# Patient Record
Sex: Female | Born: 1970 | Race: White | Hispanic: No | Smoking: Never smoker
Health system: Southern US, Community
[De-identification: ages and names within clinical notes are randomized; demographics above are authoritative.]

## PROBLEM LIST (undated history)

## (undated) DIAGNOSIS — F419 Anxiety disorder, unspecified: Secondary | ICD-10-CM

## (undated) DIAGNOSIS — R112 Nausea with vomiting, unspecified: Secondary | ICD-10-CM

## (undated) DIAGNOSIS — M722 Plantar fascial fibromatosis: Secondary | ICD-10-CM

## (undated) DIAGNOSIS — S322XXA Fracture of coccyx, initial encounter for closed fracture: Secondary | ICD-10-CM

## (undated) DIAGNOSIS — E782 Mixed hyperlipidemia: Secondary | ICD-10-CM

## (undated) DIAGNOSIS — E282 Polycystic ovarian syndrome: Secondary | ICD-10-CM

## (undated) DIAGNOSIS — Z8619 Personal history of other infectious and parasitic diseases: Secondary | ICD-10-CM

## (undated) DIAGNOSIS — D649 Anemia, unspecified: Secondary | ICD-10-CM

## (undated) DIAGNOSIS — T7840XA Allergy, unspecified, initial encounter: Secondary | ICD-10-CM

## (undated) DIAGNOSIS — Z9889 Other specified postprocedural states: Secondary | ICD-10-CM

## (undated) HISTORY — DX: Allergy, unspecified, initial encounter: T78.40XA

## (undated) HISTORY — DX: Plantar fascial fibromatosis: M72.2

## (undated) HISTORY — DX: Fracture of coccyx, initial encounter for closed fracture: S32.2XXA

## (undated) HISTORY — DX: Mixed hyperlipidemia: E78.2

## (undated) HISTORY — DX: Anemia, unspecified: D64.9

## (undated) HISTORY — DX: Personal history of other infectious and parasitic diseases: Z86.19

## (undated) HISTORY — PX: TONSILLECTOMY AND ADENOIDECTOMY: SHX28

## (undated) HISTORY — DX: Anxiety disorder, unspecified: F41.9

## (undated) HISTORY — DX: Polycystic ovarian syndrome: E28.2

---

## 1997-08-25 ENCOUNTER — Ambulatory Visit (HOSPITAL_COMMUNITY): Admission: RE | Admit: 1997-08-25 | Discharge: 1997-08-25 | Payer: Self-pay | Admitting: Obstetrics and Gynecology

## 1998-01-16 ENCOUNTER — Encounter: Payer: Self-pay | Admitting: Obstetrics and Gynecology

## 1998-01-16 ENCOUNTER — Ambulatory Visit (HOSPITAL_COMMUNITY): Admission: RE | Admit: 1998-01-16 | Discharge: 1998-01-16 | Payer: Self-pay

## 1998-04-14 ENCOUNTER — Encounter (HOSPITAL_COMMUNITY): Admission: RE | Admit: 1998-04-14 | Discharge: 1998-05-20 | Payer: Self-pay | Admitting: Obstetrics and Gynecology

## 1998-04-27 ENCOUNTER — Inpatient Hospital Stay: Admission: AD | Admit: 1998-04-27 | Discharge: 1998-04-27 | Payer: Self-pay | Admitting: Obstetrics and Gynecology

## 1998-05-19 ENCOUNTER — Inpatient Hospital Stay (HOSPITAL_COMMUNITY): Admission: AD | Admit: 1998-05-19 | Discharge: 1998-05-21 | Payer: Self-pay | Admitting: Obstetrics and Gynecology

## 1998-05-21 ENCOUNTER — Encounter (HOSPITAL_COMMUNITY): Admission: RE | Admit: 1998-05-21 | Discharge: 1998-08-19 | Payer: Self-pay | Admitting: Obstetrics and Gynecology

## 1998-12-30 ENCOUNTER — Other Ambulatory Visit: Admission: RE | Admit: 1998-12-30 | Discharge: 1998-12-30 | Payer: Self-pay | Admitting: Obstetrics and Gynecology

## 1999-12-31 ENCOUNTER — Other Ambulatory Visit: Admission: RE | Admit: 1999-12-31 | Discharge: 1999-12-31 | Payer: Self-pay | Admitting: Obstetrics and Gynecology

## 2000-12-14 ENCOUNTER — Other Ambulatory Visit: Admission: RE | Admit: 2000-12-14 | Discharge: 2000-12-14 | Payer: Self-pay | Admitting: Obstetrics and Gynecology

## 2001-12-26 ENCOUNTER — Other Ambulatory Visit: Admission: RE | Admit: 2001-12-26 | Discharge: 2001-12-26 | Payer: Self-pay | Admitting: Obstetrics and Gynecology

## 2002-12-25 ENCOUNTER — Other Ambulatory Visit: Admission: RE | Admit: 2002-12-25 | Discharge: 2002-12-25 | Payer: Self-pay | Admitting: Obstetrics and Gynecology

## 2003-03-25 ENCOUNTER — Ambulatory Visit (HOSPITAL_COMMUNITY): Admission: RE | Admit: 2003-03-25 | Discharge: 2003-03-25 | Payer: Self-pay | Admitting: Obstetrics and Gynecology

## 2003-12-18 ENCOUNTER — Other Ambulatory Visit: Admission: RE | Admit: 2003-12-18 | Discharge: 2003-12-18 | Payer: Self-pay | Admitting: Obstetrics and Gynecology

## 2004-01-15 ENCOUNTER — Ambulatory Visit: Payer: Self-pay | Admitting: Pulmonary Disease

## 2004-05-20 ENCOUNTER — Encounter: Admission: RE | Admit: 2004-05-20 | Discharge: 2004-05-20 | Payer: Self-pay | Admitting: Obstetrics and Gynecology

## 2004-12-16 ENCOUNTER — Other Ambulatory Visit: Admission: RE | Admit: 2004-12-16 | Discharge: 2004-12-16 | Payer: Self-pay | Admitting: Obstetrics and Gynecology

## 2004-12-17 ENCOUNTER — Encounter: Admission: RE | Admit: 2004-12-17 | Discharge: 2004-12-17 | Payer: Self-pay | Admitting: General Surgery

## 2005-10-28 ENCOUNTER — Ambulatory Visit: Payer: Self-pay | Admitting: Pulmonary Disease

## 2006-01-27 ENCOUNTER — Ambulatory Visit: Payer: Self-pay | Admitting: Pulmonary Disease

## 2006-01-27 LAB — CONVERTED CEMR LAB
ALT: 15 units/L (ref 0–40)
Albumin: 3.3 g/dL — ABNORMAL LOW (ref 3.5–5.2)
Alkaline Phosphatase: 42 units/L (ref 39–117)
CO2: 23 meq/L (ref 19–32)
Calcium: 9 mg/dL (ref 8.4–10.5)
Chol/HDL Ratio, serum: 3
Cholesterol: 154 mg/dL (ref 0–200)
Glomerular Filtration Rate, Af Am: 121 mL/min/{1.73_m2}
Glucose, Bld: 94 mg/dL (ref 70–99)
LDL Cholesterol: 74 mg/dL (ref 0–99)
Sodium: 138 meq/L (ref 135–145)
Total Bilirubin: 0.5 mg/dL (ref 0.3–1.2)

## 2006-02-09 ENCOUNTER — Other Ambulatory Visit: Admission: RE | Admit: 2006-02-09 | Discharge: 2006-02-09 | Payer: Self-pay | Admitting: Obstetrics and Gynecology

## 2006-11-24 ENCOUNTER — Ambulatory Visit: Payer: Self-pay | Admitting: Pulmonary Disease

## 2006-11-24 LAB — CONVERTED CEMR LAB
ALT: 17 units/L (ref 0–35)
Basophils Relative: 0.2 % (ref 0.0–1.0)
Bilirubin Urine: NEGATIVE
Bilirubin, Direct: 0.1 mg/dL (ref 0.0–0.3)
CO2: 25 meq/L (ref 19–32)
Calcium: 8.8 mg/dL (ref 8.4–10.5)
Cholesterol: 146 mg/dL (ref 0–200)
Eosinophils Relative: 0.5 % (ref 0.0–5.0)
GFR calc Af Amer: 144 mL/min
Glucose, Bld: 92 mg/dL (ref 70–99)
HCT: 39.7 % (ref 36.0–46.0)
HDL: 47.6 mg/dL (ref >39.0)
Hemoglobin: 14 g/dL (ref 12.0–15.0)
Leukocytes, UA: NEGATIVE
Lymphocytes Relative: 30.4 % (ref 12.0–46.0)
Monocytes Absolute: 0.6 10*3/uL (ref 0.2–0.7)
Neutro Abs: 6.7 10*3/uL (ref 1.4–7.7)
Neutrophils Relative %: 63.7 % (ref 43.0–77.0)
Platelets: 264 10*3/uL (ref 150–400)
Specific Gravity, Urine: 1.02 (ref 1.000–1.03)
Total Protein: 6.7 g/dL (ref 6.0–8.3)
Triglycerides: 143 mg/dL (ref 0–149)
Urobilinogen, UA: 0.2 (ref 0.0–1.0)
WBC: 10.7 10*3/uL — ABNORMAL HIGH (ref 4.5–10.5)

## 2007-06-11 ENCOUNTER — Telehealth (INDEPENDENT_AMBULATORY_CARE_PROVIDER_SITE_OTHER): Payer: Self-pay | Admitting: *Deleted

## 2007-12-07 ENCOUNTER — Ambulatory Visit: Payer: Self-pay | Admitting: Pulmonary Disease

## 2007-12-11 ENCOUNTER — Encounter: Payer: Self-pay | Admitting: Pulmonary Disease

## 2008-02-15 ENCOUNTER — Ambulatory Visit: Payer: Self-pay | Admitting: Pulmonary Disease

## 2008-04-04 ENCOUNTER — Encounter: Admission: RE | Admit: 2008-04-04 | Discharge: 2008-04-04 | Payer: Self-pay | Admitting: Obstetrics and Gynecology

## 2008-07-01 DIAGNOSIS — B2790 Infectious mononucleosis, unspecified without complication: Secondary | ICD-10-CM

## 2008-07-01 DIAGNOSIS — E282 Polycystic ovarian syndrome: Secondary | ICD-10-CM

## 2008-07-01 DIAGNOSIS — S322XXA Fracture of coccyx, initial encounter for closed fracture: Secondary | ICD-10-CM

## 2008-07-01 DIAGNOSIS — S3210XA Unspecified fracture of sacrum, initial encounter for closed fracture: Secondary | ICD-10-CM | POA: Insufficient documentation

## 2009-03-17 ENCOUNTER — Ambulatory Visit: Payer: Self-pay | Admitting: Pulmonary Disease

## 2009-03-17 DIAGNOSIS — J019 Acute sinusitis, unspecified: Secondary | ICD-10-CM

## 2009-03-27 ENCOUNTER — Ambulatory Visit: Payer: Self-pay | Admitting: Pulmonary Disease

## 2009-03-28 LAB — CONVERTED CEMR LAB
ALT: 27 units/L (ref 0–35)
AST: 24 units/L (ref 0–37)
Basophils Absolute: 0.1 10*3/uL (ref 0.0–0.1)
Bilirubin, Direct: 0.1 mg/dL (ref 0.0–0.3)
CO2: 15 meq/L — ABNORMAL LOW (ref 19–32)
Calcium: 8.9 mg/dL (ref 8.4–10.5)
Eosinophils Absolute: 0.2 10*3/uL (ref 0.0–0.7)
HDL: 48 mg/dL (ref >39)
Indirect Bilirubin: 0.3 mg/dL (ref 0.0–0.9)
Ketones, ur: NEGATIVE mg/dL
Lymphocytes Relative: 31.7 % (ref 12.0–46.0)
Monocytes Relative: 5 % (ref 3.0–12.0)
Neutrophils Relative %: 61.1 % (ref 43.0–77.0)
Platelets: 235 10*3/uL (ref 150.0–400.0)
Potassium: 4.4 meq/L (ref 3.5–5.3)
RDW: 12.1 % (ref 11.5–14.6)
Sodium: 139 meq/L (ref 135–145)
Specific Gravity, Urine: <=1.005 (ref 1.000–1.030)
Total Bilirubin: 0.4 mg/dL (ref 0.3–1.2)
Total Protein, Urine: NEGATIVE mg/dL
Urine Glucose: NEGATIVE mg/dL
Urobilinogen, UA: 0.2 (ref 0.0–1.0)

## 2009-04-08 ENCOUNTER — Telehealth (INDEPENDENT_AMBULATORY_CARE_PROVIDER_SITE_OTHER): Payer: Self-pay | Admitting: *Deleted

## 2009-04-10 ENCOUNTER — Ambulatory Visit: Payer: Self-pay | Admitting: Pulmonary Disease

## 2009-04-10 ENCOUNTER — Encounter: Admission: RE | Admit: 2009-04-10 | Discharge: 2009-04-10 | Payer: Self-pay | Admitting: Obstetrics and Gynecology

## 2009-04-19 LAB — CONVERTED CEMR LAB
BUN: 10 mg/dL (ref 6–23)
Calcium: 9 mg/dL (ref 8.4–10.5)
GFR calc non Af Amer: 117.08 mL/min (ref >60)
Potassium: 4.4 meq/L (ref 3.5–5.1)
Sodium: 138 meq/L (ref 135–145)

## 2009-04-21 ENCOUNTER — Telehealth: Payer: Self-pay | Admitting: Pulmonary Disease

## 2009-06-03 ENCOUNTER — Telehealth (INDEPENDENT_AMBULATORY_CARE_PROVIDER_SITE_OTHER): Payer: Self-pay | Admitting: *Deleted

## 2010-04-09 ENCOUNTER — Ambulatory Visit
Admission: RE | Admit: 2010-04-09 | Discharge: 2010-04-09 | Payer: Self-pay | Source: Home / Self Care | Attending: Pulmonary Disease | Admitting: Pulmonary Disease

## 2010-04-09 ENCOUNTER — Encounter: Payer: Self-pay | Admitting: Pulmonary Disease

## 2010-04-09 ENCOUNTER — Other Ambulatory Visit: Payer: Self-pay | Admitting: Pulmonary Disease

## 2010-04-09 LAB — CBC WITH DIFFERENTIAL/PLATELET
Basophils Absolute: 0 K/uL (ref 0.0–0.1)
Basophils Relative: 0.3 % (ref 0.0–3.0)
Eosinophils Absolute: 0 K/uL (ref 0.0–0.7)
Eosinophils Relative: 0.1 % (ref 0.0–5.0)
HCT: 41.4 % (ref 36.0–46.0)
Hemoglobin: 14.5 g/dL (ref 12.0–15.0)
Lymphocytes Relative: 31.7 % (ref 12.0–46.0)
Lymphs Abs: 3.8 K/uL (ref 0.7–4.0)
MCHC: 35 g/dL (ref 30.0–36.0)
MCV: 91.6 fl (ref 78.0–100.0)
Monocytes Absolute: 0.6 K/uL (ref 0.1–1.0)
Monocytes Relative: 4.9 % (ref 3.0–12.0)
Neutro Abs: 7.6 K/uL (ref 1.4–7.7)
Neutrophils Relative %: 63 % (ref 43.0–77.0)
Platelets: 277 K/uL (ref 150.0–400.0)
RBC: 4.52 Mil/uL (ref 3.87–5.11)
RDW: 12.2 % (ref 11.5–14.6)
WBC: 12.1 K/uL — ABNORMAL HIGH (ref 4.5–10.5)

## 2010-04-09 LAB — LIPID PANEL
Cholesterol: 141 mg/dL (ref 0–200)
HDL: 46.1 mg/dL (ref >39.00)
Total CHOL/HDL Ratio: 3
Triglycerides: 216 mg/dL — ABNORMAL HIGH (ref 0.0–149.0)
VLDL: 43.2 mg/dL — ABNORMAL HIGH (ref 0.0–40.0)

## 2010-04-09 LAB — HEPATIC FUNCTION PANEL
ALT: 17 U/L (ref 0–35)
AST: 17 U/L (ref 0–37)
Albumin: 3.7 g/dL (ref 3.5–5.2)
Alkaline Phosphatase: 46 U/L (ref 39–117)
Bilirubin, Direct: 0.1 mg/dL (ref 0.0–0.3)
Total Bilirubin: 0.9 mg/dL (ref 0.3–1.2)
Total Protein: 7.1 g/dL (ref 6.0–8.3)

## 2010-04-09 LAB — BASIC METABOLIC PANEL WITH GFR
BUN: 13 mg/dL (ref 6–23)
CO2: 24 meq/L (ref 19–32)
Calcium: 9.2 mg/dL (ref 8.4–10.5)
Chloride: 104 meq/L (ref 96–112)
Creatinine, Ser: 0.6 mg/dL (ref 0.4–1.2)
GFR: 116.51 mL/min (ref >60.00)
Glucose, Bld: 77 mg/dL (ref 70–99)
Potassium: 4.4 meq/L (ref 3.5–5.1)
Sodium: 137 meq/L (ref 135–145)

## 2010-04-09 LAB — TSH: TSH: 1.42 u[IU]/mL (ref 0.35–5.50)

## 2010-04-09 LAB — LDL CHOLESTEROL, DIRECT: Direct LDL: 70.3 mg/dL

## 2010-04-11 LAB — CONVERTED CEMR LAB
ALT: 20 units/L (ref 0–35)
AST: 21 units/L (ref 0–37)
Albumin: 3.6 g/dL (ref 3.5–5.2)
Basophils Relative: 0.6 % (ref 0.0–3.0)
CO2: 25 meq/L (ref 19–32)
Calcium: 9 mg/dL (ref 8.4–10.5)
Chloride: 107 meq/L (ref 96–112)
Eosinophils Relative: 0.6 % (ref 0.0–5.0)
GFR calc Af Amer: 177 mL/min
GFR calc non Af Amer: 146 mL/min
HCT: 42.1 % (ref 36.0–46.0)
Hemoglobin: 15 g/dL (ref 12.0–15.0)
LDL Cholesterol: 92 mg/dL (ref 0–99)
Monocytes Absolute: 0.6 10*3/uL (ref 0.1–1.0)
Monocytes Relative: 5.4 % (ref 3.0–12.0)
Mucus, UA: NEGATIVE
Nitrite: NEGATIVE
Platelets: 244 10*3/uL (ref 150–400)
Sodium: 138 meq/L (ref 135–145)
Specific Gravity, Urine: 1.01 (ref 1.000–1.03)
Total Bilirubin: 0.7 mg/dL (ref 0.3–1.2)
Total CHOL/HDL Ratio: 4.7
Total Protein, Urine: NEGATIVE mg/dL
Total Protein: 7 g/dL (ref 6.0–8.3)
Urine Glucose: NEGATIVE mg/dL
WBC, UA: NONE SEEN cells/hpf
WBC: 11.2 10*3/uL — ABNORMAL HIGH (ref 4.5–10.5)

## 2010-04-12 ENCOUNTER — Encounter
Admission: RE | Admit: 2010-04-12 | Discharge: 2010-04-12 | Payer: Self-pay | Source: Home / Self Care | Attending: Obstetrics and Gynecology | Admitting: Obstetrics and Gynecology

## 2010-04-13 NOTE — Progress Notes (Signed)
Summary: sinus drainage  Phone Note Call from Patient   Caller: Patient Call For: nadle Summary of Call: ear pain and sinus drainage with cough [facility] Initial call taken by: Rickard Patience,  June 03, 2009 10:28 AM  Follow-up for Phone Call        Spoke with pt.  She states that she started to have nasal congestion and sinus pressure/HA about 2 wks ago.  She saw her dentist and was txed with cephalexin x 10 days- has 3 doses left.  She states that abx is "not touching it"- now she has ear pain and prod cough with dark yellow sputum.  Please advise recs thanks NKDA Follow-up by: Vernie Murders,  June 03, 2009 10:40 AM  Additional Follow-up for Phone Call Additional follow up Details #1::        per SN---keflex should have done the job of clearing that up---try avelox 400mg   1 tab daily  #7 use the mucinex max 1 by mouth two times a day with plenty of fluids and the next step will be to send her to see ENT if this does not get better after this round of abx. Randell Loop CMA  June 03, 2009 11:08 AM       Additional Follow-up for Phone Call Additional follow up Details #2::    Spoke with pt and notified of the above recs per SN.  Pt verbalized understanding.  Rx for avelox was sent to pharm. Follow-up by: Vernie Murders,  June 03, 2009 11:14 AM  New/Updated Medications: AVELOX 400 MG TABS (MOXIFLOXACIN HCL) 1 by mouth once daily x 7 days Prescriptions: AVELOX 400 MG TABS (MOXIFLOXACIN HCL) 1 by mouth once daily x 7 days  #7 x 0   Entered by:   Vernie Murders   Authorized by:   Michele Mcalpine MD   Signed by:   Vernie Murders on 06/03/2009   Method used:   Electronically to        [facility] [address] [phone]* (retail)       [address]       [place], [place]  [address]       Ph: [phone] or [phone]       Fax: [phone]   RxID:   [id number]   Appended Document: sinus drainage-MED CHANGE FROM AVELOX TO AUGMENTIN PHARMACY CALLED AND  STATES PT SAYS AVELOX RX THAT WAS GIVEN IS TO EXPENSIVE WOULD LIKE REPLACEMENT PER SN OK TO CHANGE TO AUGMENTIN 875 1 by mouth two times a day #20 NO REFILL   Clinical Lists Changes  Medications: Changed medication from AVELOX 400 MG TABS (MOXIFLOXACIN HCL) 1 by mouth once daily x 7 days to AUGMENTIN 875-125 MG TABS (AMOXICILLIN-POT CLAVULANATE) 1 by mouth two times a day - Signed Rx of AUGMENTIN 875-125 MG TABS (AMOXICILLIN-POT CLAVULANATE) 1 by mouth two times a day;  #20 x 0;  Signed;  Entered by: Philipp Deputy CMA;  Authorized by: Michele Mcalpine MD;  Method used: Electronically to [facility] [address] [phone]*, [address] [place], [place], [place]  [address], Ph: [phone] or [phone], Fax: [phone]    Prescriptions: AUGMENTIN 875-125 MG TABS (AMOXICILLIN-POT CLAVULANATE) 1 by mouth two times a day  #20 x 0   Entered by:   Philipp Deputy CMA   Authorized by:   Michele Mcalpine MD   Signed by:   Philipp Deputy CMA on 06/03/2009  Method used:   Electronically to        [facility]. [address] [phone]* (retail)       [address]       [place], [place]  [address]       Ph: [phone] or [phone]       Fax: [phone]   RxID:   [id number]

## 2010-04-13 NOTE — Progress Notes (Signed)
Summary: results    Phone Note Call from Patient   Caller: Patient Call For: Kyandra Mcclaine Summary of Call: calling for lab results Initial call taken by: Rickard Patience,  April 21, 2009 8:19 AM  Follow-up for Phone Call        [facility]. Aundra Millet Reynolds LPN  April 21, 2009 8:50 AM    per SN----the low bicarb level was a lab error as expected---lmomtcb for pt to make her aware. Randell Loop CMA  April 21, 2009 9:00 AM   pt advised.Carron Curie CMA  April 21, 2009 10:53 AM

## 2010-04-13 NOTE — Progress Notes (Signed)
Summary: labs  Phone Note Call from Patient   Caller: Patient Call For: nadel Summary of Call: calling having labs redone per Dr Kriste Basque Initial call taken by: Rickard Patience,  April 08, 2009 1:10 PM  Follow-up for Phone Call        called, spoke with pt.  Pt states per phone tree message from [facility], she needs to have some labs redone.  Per append dated 03/28/09 from SN. looks like pt needs to have BMET redrawn.  Will forward to SN-please advise if pt needs any other labs.  Thanks! Gweneth Dimitri RN  April 08, 2009 2:06 PM   Additional Follow-up for Phone Call Additional follow up Details #1::        thats the only lab that she will need repeated. code is 401.9   thanks Randell Loop CMA  April 08, 2009 2:11 PM     Additional Follow-up for Phone Call Additional follow up Details #2::    Pt requesting to come in on Friday morning for this lab.  Lab scheduled in IDX.  pt aware she is to report to lab for this. She verbalized understanding.  Follow-up by: Gweneth Dimitri RN,  April 08, 2009 2:26 PM

## 2010-04-13 NOTE — Assessment & Plan Note (Signed)
Summary: CPX/FASTING/APC   CC:  Yearly ROV & CPX....  History of Present Illness: 40 y/o WF here for [name] yearly follow up and CPX... she has hypercholestrolemia treated w/ Simva40...   ~  March 27, 2009:  seen recently by TP w/ URI/ sinusitis requiring Augmentin, Pred, Mucinex, Hydromet cough syrup- finally better... otherw had [name] good yr- she decr the Simva40 to just 1/2 tab daily over the last 3-59mo (?to save $$)...  she is followed by [facility] for GYN w/ polycystic dis & HA w/ cycle- changed BCP to [facility] & improved... no other complaints or concerns- some incr stress w/ parents living w/ her ([place])...    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  HYPERCHOLESTEROLEMIA (ICD-272.0) - on SIMVASTATIN 40mg /d, but she decr to 1/2 tab daily recently... we discussed diet + exercise.  ~  FLP 9/08 showed TChol 146, TG 143, HDL 48, LDL 70...  ~  FLP 12/09 showed TChol 159, TG 165, HDL 34, LDL 92... reminder- low fat diet...  ~  FLP 1/11 showed TChol 182, TG 294, HDL 48, LDL 75... OK on Simva20, may need Fibrate- diet 1st.  Hx of MONONUCLEOSIS (ICD-075) - occured at age 29... no known residual.  POLYCYSTIC OVARIAN DISEASE (ICD-256.4) - followed by DrVHaygood who has her on BCPs...  ~  1/11:  [facility] & improved...  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  Hx of FRACTURE, COCCYX (ICD-805.6)    Allergies (verified): No Known Drug Allergies  Comments:  Nurse/Medical Assistant: The patient's medications and allergies were reviewed with the patient and were updated in the Medication and Allergy Lists.  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 allergies - mother, father heart disease - father  rheumatism - mother, father cancer - mother had precancerous cell DM - brother     Social History: Reviewed history from 03/17/2009 and no changes required. 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  The patient denies fever, chills, sweats, anorexia, fatigue, weakness, malaise, weight loss, sleep disorder, blurring, diplopia, eye irritation, eye discharge, vision loss, eye pain, photophobia, earache, ear discharge, tinnitus, decreased hearing, nosebleeds, sore throat, hoarseness, chest pain, palpitations, syncope, dyspnea on exertion, orthopnea, PND, peripheral edema, cough, dyspnea at rest, excessive sputum, hemoptysis, wheezing, pleurisy, nausea, vomiting, diarrhea, constipation, change in bowel habits, abdominal pain, melena, hematochezia, jaundice, gas/bloating, indigestion/heartburn, dysphagia, odynophagia, dysuria, hematuria, urinary frequency, urinary hesitancy, nocturia, incontinence, back pain, joint pain, joint swelling, muscle cramps, muscle weakness, stiffness, arthritis, sciatica, restless legs, leg pain at night, leg pain with exertion, rash, itching, dryness, suspicious lesions, paralysis, paresthesias, seizures, tremors, vertigo, transient blindness, frequent falls, frequent headaches, difficulty walking, depression, anxiety, memory loss, confusion, cold intolerance, heat intolerance, polydipsia, polyphagia, polyuria, unusual weight change, abnormal bruising, bleeding, enlarged lymph nodes, urticaria, allergic rash, hay fever, and recurrent infections.    Vital Signs:  Patient profile:   40 year old female Height:      60 inches Weight:      162.50 pounds  BMI:     31.85 O2 Sat:      97 % on Room air Temp:     97.5 degrees F  oral Pulse rate:   98 / minute BP sitting:   130 / 84  (right arm) Cuff size:   regular  Vitals Entered By: Randell Loop CMA (March 27, 2009 10:19 AM)  O2 Sat at Rest %:  97 O2 Flow:  Room air  Physical Exam  Additional Exam:  WD, WN, 40 y/o WF in NAD... GENERAL:  Alert & oriented; pleasant & cooperative... HEENT:  [place]/AT, EOM-wnl, PERRLA, Fundi-benign, EACs-clear, TMs-wnl, NOSE-clear, THROAT-clear & wnl. NECK:  Supple w/ full ROM; no JVD; normal carotid impulses w/o bruits; no thyromegaly or nodules palpated; no lymphadenopathy. CHEST:  Clear to P & [name]; without wheezes/ rales/ or rhonchi. HEART:  Regular Rhythm; without murmurs/ rubs/ or gallops. ABDOMEN:  Soft & nontender; normal bowel sounds; no organomegaly or masses detected. EXT: without deformities or arthritic changes; no varicose veins/ venous insuffic/ or edema. NEURO:  CN's intact; motor testing normal; sensory testing normal; gait normal & balance OK. DERM:  No lesions noted; no rash etc...     EKG  Procedure date:  03/27/2009  Findings:      Normal sinus rhythm with rate of:  86/min... Tracing is WNL, NAD...  SN   MISC. Report  Procedure date:  03/27/2009  Findings:      CBC Platelet w/Diff (CBCD)   White Cell Count     [H]  10.8 K/uL                   4.5-10.5   Red Cell Count            4.83 Mil/uL                 3.87-5.11   Hemoglobin           [H]  15.1 g/dL                   81.1-91.4   Hematocrit                45.7 %                      36.0-46.0   MCV                       94.6 fl                     78.0-100.0   Platelet Count            235.0 K/uL                  150.0-400.0   Neutrophil %              61.1 %                      43.0-77.0   Lymphocyte %              31.7 %                      12.0-46.0   Monocyte %                5.0 %  3.0-12.0   Eosinophils%              1.5 %                       0.0-5.0   Basophils %               0.7 %                        0.0-3.0   TSH (TSH)   FastTSH                   1.64 uIU/mL                 0.35-5.50  UDip w/Micro (URINE)   Color                     LT. YELLOW   Clarity                   CLEAR                       Clear   Specific Gravity          <=1.005                     1.000 - 1.030   Urine Ph                  6.0                         5.0-8.0   Protein                   NEGATIVE                    Negative   Urine Glucose             NEGATIVE                    Negative   Ketones                   NEGATIVE                    Negative   Urine Bilirubin           NEGATIVE                    Negative   Blood                     LARGE                       Negative  Urobilinogen              0.2                         0.0 - 1.0   Leukocyte Esterace        NEGATIVE                    Negative   Nitrite                   NEGATIVE  Negative   Urine RBC                 11-20/hpf                   0-2/hpf   Urine Epith               Rare(0-4/hpf)               Rare(0-4/hpf)   Urine Bacteria            Rare(<10/hpf)               None   Comments:      Basic Metabolic Panel (78469)   Sodium                    139 mEq/L                   135-145   Potassium                 4.4 mEq/L                   3.5-5.3   Chloride                  103 mEq/L                   96-112   CO2                  [L]  15 mEq/L                    19-32     Result repeated and verified.   Glucose                   86 mg/dL                    62-95   BUN                       12 mg/dL                    2-84   Creatinine                0.59 mg/dL                  0.40-1.20   Calcium                   8.9 mg/dL                   1.3-24.4  Lipid Profile (01027)   Cholesterol               182 mg/dL                   2-536   Triglyceride         [H]  294 mg/dL                   <644   HDL Cholesterol           48 mg/dL                    >03  LDL Cholesterol (Calc)  75  mg/dL                    0-45  Liver Profile (40981)   Bilirubin, Total          0.4 mg/dL                   1.9-1.4   Bilirubin, Direct         0.1 mg/dL                   7.8-2.9   Indirect Bilirubin        0.3 mg/dL                   5.6-2.1   Alkaline Phosphatase      41 U/L                      39-117   AST/SGOT                  24 U/L                      0-37   ALT/SGPT                  27 U/L                      0-35   Total Protein             7.3 g/dL                    3.0-8.6   Albumin                   4.1 g/dL                    5.7-8.4   Impression & Recommendations:  Problem # 1:  PHYSICAL EXAMINATION (ICD-V70.0) REVIEW of DATA shows low bicarb level = 15 why? - no reason apparent, r/o lab error w/ repeat BMET & go from there...  Orders: EKG w/ Interpretation (93000) Venipuncture (69629) TLB-CBC Platelet - w/Differential (85025-CBCD) TLB-TSH (Thyroid Stimulating Hormone) (84443-TSH) TLB-Udip w/ Micro (81001-URINE) T- * Misc. Laboratory test [id number])  Problem # 2:  HYPERCHOLESTEROLEMIA (ICD-272.0) OK on the SIMVA20, but TG elevated and needs improvement... may need fibrate- hold for now & Rx w/ diet & f/u... Her updated medication list for this problem includes:    Simvastatin 40 Mg Tabs (Simvastatin) .Marland Kitchen... Take as directed at bedtime...  Problem # 3:  POLYCYSTIC OVARIAN DISEASE (ICD-256.4) [facility]- on BCP's...   Problem # 4:  OTHER MEDICAL PROBLEMS AS NOTED>>>  Complete Medication List: 1)  Simvastatin 40 Mg Tabs (Simvastatin) .... Take as directed at bedtime.Marland KitchenMarland Kitchen 2)  Necon 1/35 (28) 1-35 Mg-mcg Tabs (Norethindrone-eth estradiol) .... Take 1 tablet by mouth once [name] day  Other Orders: Prescription Created Electronically [id number])  Patient Instructions: 1)  Today we updated your med list- see below.... 2)  We refilled the simvastatin Rx as discussed... 3)  Today we did your follow up FASTING blood work... please call the "phone tree" in [name] few days  for your lab results.Marland KitchenMarland Kitchen 4)  Keep up the good work w/ diet + exercise... 5)  Call for any problems.Marland KitchenMarland Kitchen 6)  Please schedule [name] follow-up appointment in 1 year, sooner as needed... Prescriptions: SIMVASTATIN 40 MG  TABS (SIMVASTATIN) take as directed at  bedtime...  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  HYPERCHOLESTEROLEMIA (ICD-272.0) - on SIMVASTATIN 40mg /d... we discussed diet + exercise.  ~  FLP 9/08 showed TChol 146, TG 143, HDL 48, LDL 70...  ~  FLP 12/09 showed TChol 159, TG 165, HDL 34, LDL 92... reminder- low fat diet...  Hx of MONONUCLEOSIS (ICD-075) - occured at age 74...  POLYCYSTIC OVARIAN DISEASE (ICD-256.4) - followed by DrVHaygood who has her on BCPs...  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  Hx of FRACTURE, COCCYX (ICD-805.6)   Recorded: 03/27/2009 10:38 AM P/PR: 92 ms / 135 ms - Heart rate (maximum exercise) QRS: 83 QT/QTc/QTd: 358 ms / 405 ms / 69 ms - Heart rate (maximum exercise)  P/QRS/T axis: 49 deg / 97 deg / 12 deg - Heart rate (maximum exercise)  Heartrate: 87 bpm  Interpretation:  Normal sinus rhythm with rate of:  86/min... Tracing is WNL, NAD...  SN

## 2010-04-13 NOTE — Assessment & Plan Note (Signed)
Summary: Acute NP office visit - prod cough   CC:  head congestion with PND causing prod cough with thick yellow-brown sputum, increased SOB, and hoarseness x96month - has finished zpak given by dentist for poss sinus infection.Marland Kitchen  History of Present Illness: 40  y/o WF with known history of  hypercholestrolemia   March 17, 2009--Presents for an acute office visit. Complains of head congestion with PND causing prod cough with thick yellow-brown sputum, increased SOB, hoarseness x13month - has finished zpak given by dentist for poss sinus infection. ( 2 weeks ago) Not anybetter, now with progressively worsening cough. Ear pain and teeth pain. Denies chest pain, dyspnea, orthopnea, hemoptysis, fever, n/v/d, edema, headache,recent travel   Medications Prior to Update: 1)  Simvastatin 40 Mg  Tabs (Simvastatin) .... Take 1 Tablet By Mouth At Bedtime.Marland KitchenMarland Kitchen 2)  Necon 1/35 (28) 1-35 Mg-Mcg Tabs (Norethindrone-Eth Estradiol) .... Take 1 Tablet By Mouth Once A Day  Current Medications (verified): 1)  Simvastatin 40 Mg  Tabs (Simvastatin) .... Take 1 Tablet By Mouth At Bedtime.Marland KitchenMarland Kitchen 2)  Necon 1/35 (28) 1-35 Mg-Mcg Tabs (Norethindrone-Eth Estradiol) .... 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  HYPERCHOLESTEROLEMIA (ICD-272.0) - on SIMVASTATIN 40mg /d... we discussed diet + exercise.  ~  FLP 9/08 showed TChol 146, TG 143, HDL 48,  LDL 70...  ~  FLP 12/09 showed TChol 159, TG 165, HDL 34, LDL 92... reminder- low fat diet...  Hx of MONONUCLEOSIS (ICD-075) - occured at age 70...  POLYCYSTIC OVARIAN DISEASE (ICD-256.4) - followed by DrVHaygood who has her on BCPs...  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 Is Patient Diabetic? No Comments Medications reviewed with patient Daytime contact number verified with patient. Boone Master CNA  March 17, 2009 10:59 AM    Physical Exam  Additional Exam:  WD, WN, 40  y/o WF in NAD... GENERAL:  Alert & oriented; pleasant & cooperative. HEENT:  [place]/AT  , EACs-clear, TMs-mild redness,  NOSE-red, max sinus tenderness. , THROAT-clear & wnl. NECK:  Supple w/ full ROM; no JVD; normal carotid impulses w/o bruits; no thyromegaly or nodules palpated; no lymphadenopathy. CHEST:  Clear to P & A; without wheezes/ rales/ or rhonchi. HEART:  Regular Rhythm; without murmurs/ rubs/ or gallops. ABDOMEN:  Soft & nontender; normal bowel sounds; no organomegaly or masses detected. EXT: without deformities or arthritic  changes; no varicose veins/ venous insuffic/ or edema.     Impression & Recommendations:  Problem # 1:  SINUSITIS, ACUTE (ICD-461.9) Slow to resolve w/ associated bronchitis  REC: Augmentin 875mg  two times a day w/ food for 10 days Mucinex DM two times a day as needed cough/congestion Prednisone taper over next week.  Please contact office for sooner follow up if symptoms do not improve or worsen  follow up Dr. Kriste Basque as scheduled  Her updated medication list for this problem includes:    Augmentin 875-125 Mg Tabs (Amoxicillin-pot clavulanate) .Marland Kitchen... 1 by mouth two times a day    Hydromet 5-1.5 Mg/79ml Syrp (Hydrocodone-homatropine) .Marland Kitchen... 1-2 tsp every 4-6 hr as needed cough  Orders: Est. Patient Level IV ([address])  Medications Added to Medication List This Visit: 1)  Augmentin 875-125 Mg Tabs (Amoxicillin-pot clavulanate) .Marland Kitchen.. 1 by mouth two times a day 2)  Prednisone 10 Mg Tabs (Prednisone) .... 4 tabs for 2 days, then 3 tabs for 2 days, 2 tabs for 2 days, then 1 tab for 2 days, then stop 3)  Hydromet 5-1.5 Mg/14ml Syrp (Hydrocodone-homatropine) .Marland Kitchen.. 1-2 tsp every 4-6 hr as needed cough  Complete Medication List: 1)  Simvastatin 40 Mg Tabs (Simvastatin) .... Take 1 tablet by mouth at bedtime.Marland KitchenMarland Kitchen 2)  Necon 1/35 (28) 1-35 Mg-mcg Tabs (Norethindrone-eth estradiol) .... Take 1 tablet by mouth once a day 3)  Augmentin 875-125 Mg Tabs (Amoxicillin-pot clavulanate) .Marland Kitchen.. 1 by mouth two times a day 4)  Prednisone 10 Mg Tabs (Prednisone) .... 4 tabs for 2 days, then 3 tabs for 2 days, 2 tabs for 2 days, then 1 tab for 2 days, then stop 5)  Hydromet 5-1.5 Mg/34ml Syrp (Hydrocodone-homatropine) .Marland Kitchen.. 1-2 tsp every 4-6 hr as needed cough  Patient Instructions: 1)  Augmentin 875mg  two times a day w/ food for 10 days 2)  Mucinex DM two times a day as needed cough/congestion 3)  Prednisone taper over next week.  4)  Hydromet 1 tsp every 6 hr as needed  5)  Please contact office for sooner follow up if  symptoms do not improve or worsen  6)  follow up Dr. Kriste Basque as scheduled  Prescriptions: HYDROMET 5-1.5 MG/5ML SYRP (HYDROCODONE-HOMATROPINE) 1-2 tsp every 4-6 hr as needed cough  #8 oz x 0   Entered and Authorized by:   Rubye Oaks NP   Signed by:   Sonu Kruckenberg NP on 03/17/2009   Method used:   Print then Give to Patient   RxID:   [id number] PREDNISONE 10 MG TABS (PREDNISONE) 4 tabs for 2 days, then 3 tabs for 2 days, 2 tabs for 2 days, then 1 tab for 2 days, then stop  #20 x 0   Entered and Authorized by:   Rubye Oaks NP   Signed by:   Deveney Bayon NP on 03/17/2009   Method used:   Electronically to        [facility]. [address] [phone]* (retail)  [address]       [place], [place]  [address]       Ph: [phone] or [phone]       Fax: [phone]   RxID:   [id number] AUGMENTIN 875-125 MG TABS (AMOXICILLIN-POT CLAVULANATE) 1 by mouth two times a day  #20 x 0   Entered and Authorized by:   Rubye Oaks NP   Signed by:   Tor Tsuda NP on 03/17/2009   Method used:   Electronically to        [facility]. [address] [phone]* (retail)       [address]       [place], [place]  [address]       Ph: [phone] or [phone]       Fax: [phone]   RxID:   [id number]

## 2010-04-15 ENCOUNTER — Telehealth: Payer: Self-pay | Admitting: Pulmonary Disease

## 2010-04-21 NOTE — Progress Notes (Signed)
Summary: results  Phone Note Call from Patient   Caller: Patient Call For: NADEL Summary of Call: Patient phoned stated that she had her cpx with dr Kriste Basque and did not get the id to call for results. Patient would like the results she can be reached at [phone] Initial call taken by: Vedia Coffer,  April 15, 2010 11:17 AM  Follow-up for Phone Call        Pt given number for phone tree and MRN number.Carron Curie CMA  April 15, 2010 12:12 PM

## 2010-04-24 DIAGNOSIS — E782 Mixed hyperlipidemia: Secondary | ICD-10-CM | POA: Insufficient documentation

## 2010-04-24 DIAGNOSIS — E559 Vitamin D deficiency, unspecified: Secondary | ICD-10-CM | POA: Insufficient documentation

## 2010-04-29 NOTE — Assessment & Plan Note (Signed)
Summary: cpx/ mbw   CC:  Yearly ROV & review of mult medical problems....  History of Present Illness: 40 y/o WF here for a yearly follow up and CPX... she has hypercholestrolemia treated w/ Simva20...   ~  March 27, 2009:  seen recently by TP w/ URI/ sinusitis requiring Augmentin, Pred, Mucinex, Hydromet cough syrup- finally better... otherw had a good yr- she decr the Simva40 to just 1/2 tab daily over the last 3-47mo (?to save $$)...  she is followed by [facility] for GYN w/ polycystic dis & HA w/ cycle- changed BCP to [facility] & improved... no other complaints or concerns- some incr stress w/ parents living w/ [place] ([place]   ~  April 09, 2010:  she's had a good yr but recent URI treated by Dentist w/ ZPak; but notes incr congestion, thick yellow sput, HA, etc... **we discussed rx w/ Augmentin, Depo/ Dosepak, Mucinex, Delsym, etc...  she has Hyperlipidemia & Chol OK on the Simva20 but needs better low fat diet;  she reports Vit D was low per GYN & placed on 50K weekly...    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  ~  FLP 9/08 showed TChol 146, TG 143, HDL 48, LDL 70... Chol is good on Simva20.  ~  FLP 12/09 showed TChol 159, TG 165, HDL 34, LDL 92... reminder- low fat diet...  ~  FLP 1/11 showed TChol 182, TG 294, HDL 48, LDL 75... OK on Simva20, may need Fibrate.  ~  FLP 1/12 showed TChol 141, TG 216, HDL 46,LDL 70... contin same w/ low fat diet.  Hx of MONONUCLEOSIS (ICD-075) - occured at age 81... no known residual.  POLYCYSTIC OVARIAN DISEASE (ICD-256.4) - followed by DrVHaygood who has her on BCPs...  ~  1/11:  [facility] & improved...  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  Hx of FRACTURE, COCCYX (ICD-805.6)  VITAMIN D  DEFICIENCY (ICD-268.9) - she tells me that her VitD level was low when checked by GYN & she's been placed on 50,000 u weekly...   Current Meds:  ASPIRIN 81 MG TBEC (ASPIRIN) Take 1 tablet by mouth once a day SIMVASTATIN 40 MG  TABS (SIMVASTATIN) take as directed at bedtime... FISH OIL 1000 MG CAPS (OMEGA-3 FATTY ACIDS) Take 1 tablet by mouth once a day NECON 1/35 (28) 1-35 MG-MCG TABS (NORETHINDRONE-ETH ESTRADIOL) Take 1 tablet by mouth once a day VITAMIN D3 50000 UNIT CAPS (CHOLECALCIFEROL) take one capsule by mouth once weekly VITAMIN E 100 UNIT CAPS (VITAMIN E) Take 1 tablet by mouth once a day AUGMENTIN 875-125 MG TABS (AMOXICILLIN-POT CLAVULANATE) take 1 tab by mouth two times a day... PREDNISONE (PAK) 5 MG TABS (PREDNISONE) take as directed til gone... MUCINEX 600 MG XR12H-TAB (GUAIFENESIN) take 1-2 tabs by mouth two times a day w/ fluids...    Preventive Screening-Counseling & Management  Alcohol-Tobacco     Smoking Status: never  Allergies (verified): No Known Drug Allergies  Comments:  Nurse/Medical Assistant: The patient's medications and allergies were reviewed with the patient and were updated in the Medication and Allergy Lists.  Past History:  Past Medical History: MIXED HYPERLIPIDEMIA (ICD-272.2) Hx of MONONUCLEOSIS (ICD-075) POLYCYSTIC OVARIAN DISEASE (ICD-256.4) Hx of PLANTAR FASCIITIS (ICD-728.71) Hx of FRACTURE, COCCYX (ICD-805.6) VITAMIN D DEFICIENCY (  ICD-268.9)  Past Surgical History: S/P T & A age 19  Family History: Reviewed history from 03/17/2009 and no changes required. allergies - mother, father heart disease - father  rheumatism - mother, father cancer - mother had precancerous cell DM - brother  Social History: Reviewed history from 03/17/2009 and no changes required. 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  The patient denies fever, chills, sweats, anorexia, fatigue, weakness, malaise, weight loss, sleep disorder, blurring, diplopia, eye irritation, eye discharge, vision loss, eye pain, photophobia, earache, ear discharge, tinnitus, decreased hearing, nosebleeds, chest pain, palpitations, syncope, dyspnea on exertion, orthopnea, PND, peripheral edema, dyspnea at rest, excessive sputum, hemoptysis, wheezing, pleurisy, nausea, vomiting, diarrhea, constipation, change in bowel habits, abdominal pain, melena, hematochezia, jaundice, gas/bloating, indigestion/heartburn, dysphagia, odynophagia, dysuria, hematuria, urinary frequency, urinary hesitancy, nocturia, incontinence, back pain, joint pain, joint swelling, muscle cramps, muscle weakness, stiffness, arthritis, sciatica, restless legs, leg pain at night, leg pain with exertion, rash, itching, dryness, suspicious lesions, paralysis, paresthesias, seizures, tremors, vertigo, transient blindness, frequent falls, frequent headaches, difficulty walking, depression, anxiety, memory loss, confusion, cold intolerance, heat intolerance, polydipsia, polyphagia, polyuria, unusual weight change, abnormal bruising, bleeding, enlarged lymph nodes, urticaria, allergic rash, hay fever, and recurrent infections.    Vital Signs:  Patient profile:   40 year old female Height:      60 inches Weight:      156 pounds BMI:     30.58 O2 Sat:      99 % on Room air Temp:     96.8 degrees F oral Pulse rate:   93 / minute BP sitting:   118 / 82  (right arm) Cuff size:   regular  Vitals Entered By: Randell Loop CMA (April 09, 2010 9:20 AM)  O2 Sat at Rest %:  99 O2 Flow:  Room air CC: Yearly ROV & review of mult medical problems... Is Patient Diabetic? No Pain Assessment Patient in pain? no      Comments meds updated today with pt   Physical Exam  Additional Exam:  WD, WN, 40 y/o WF in NAD... GENERAL:  Alert & oriented; pleasant & cooperative... HEENT:   [place]/AT, EOM-wnl, PERRLA, Fundi-benign, EACs-clear, TMs-wnl, NOSE-clear, THROAT-clear & wnl. NECK:  Supple w/ full ROM; no JVD; normal carotid impulses w/o bruits; no thyromegaly or nodules palpated; no lymphadenopathy. CHEST:  Clear to P & A; without wheezes/ rales/ or rhonchi. HEART:  Regular Rhythm; without murmurs/ rubs/ or gallops. ABDOMEN:  Soft & nontender; normal bowel sounds; no organomegaly or masses detected. EXT: without deformities or arthritic changes; no varicose veins/ venous insuffic/ or edema. NEURO:  CN's intact; motor testing normal; sensory testing normal; gait normal & balance OK. DERM:  No lesions noted; no rash etc...    Impression & Recommendations:  Problem # 1:  PHYSICAL EXAMINATION (ICD-V70.0) CXR, EKG, Labs >> all reviewed w/ pt... Orders: EKG w/ Interpretation (93000) T-2 View CXR (71020TC) TLB-BMP (Basic Metabolic Panel-BMET) (80048-METABOL) TLB-Hepatic/Liver Function Pnl (80076-HEPATIC) TLB-CBC Platelet - w/Differential (85025-CBCD) TLB-Lipid Panel (80061-LIPID) TLB-TSH (Thyroid Stimulating Hormone) (84443-TSH)  Problem # 2:  HYPERCHOLESTEROLEMIA (ICD-272.0) She has a mixed hyperlipidemia> on Simva20 + low fat diet... Her updated medication list for this problem includes:    Simvastatin 40 Mg Tabs (Simvastatin) .Marland Kitchen... Take as directed at bedtime...  Problem # 3:  POLYCYSTIC OVARIAN DISEASE (ICD-256.4) Followed by GYN on BCP's &  improved..  Problem # 4:  VIT D DEFICIENCY >>> Vit D level followed by GYN & Rx w/ 50K weekly...  Complete Medication List: 1)  Aspirin 81 Mg Tbec (Aspirin) .... Take 1 tablet by mouth once a day 2)  Simvastatin 40 Mg Tabs (Simvastatin) .... Take as directed at bedtime.Marland KitchenMarland Kitchen 3)  Fish Oil 1000 Mg Caps (Omega-3 fatty acids) .... Take 1 tablet by mouth once a day 4)  Necon 1/35 (28) 1-35 Mg-mcg Tabs (Norethindrone-eth estradiol) .... Take 1 tablet by mouth once a day 5)  Vitamin D3 50000 Unit Caps (Cholecalciferol) .... Take one  capsule by mouth once weekly 6)  Vitamin E 100 Unit Caps (Vitamin e) .... Take 1 tablet by mouth once a day 7)  Augmentin 875-125 Mg Tabs (Amoxicillin-pot clavulanate) .... Take 1 tab by mouth two times a day... 8)  Prednisone (pak) 5 Mg Tabs (Prednisone) .... Take as directed til gone... 9)  Mucinex 600 Mg Xr12h-tab (Guaifenesin) .... Take 1-2 tabs by mouth two times a day w/ fluids...  Patient Instructions: 1)  Today we updated your med list- see below.... 2)  We refilled your Simvastatin.Marland KitchenMarland Kitchen 3)  For the upper resp infection:  take the AUGMENTIN antibiotic twice daily x7d, and the Pred Dosepak as directed... 4)  You may also use the OTC MUCINEX 1-2 tabs twice daily w/ lots of fluids, and DELSYM cough syrup as needed... 5)  Keep up the good work w/ your LOW FAT diet & exercise program... 6)  Call for any problems.Marland KitchenMarland Kitchen 7)  Please schedule a follow-up appointment in 1 year. Prescriptions: PREDNISONE (PAK) 5 MG TABS (PREDNISONE) take as directed til gone...  #5mg -6d pack x 0   Entered and Authorized by:   Michele Mcalpine MD   Signed by:   Michele Mcalpine MD on 04/09/2010   Method used:   Print then Give to Patient   RxID:   [id number] AUGMENTIN 875-125 MG TABS (AMOXICILLIN-POT CLAVULANATE) take 1 tab by mouth two times a day...  #14 x 1   Entered and Authorized by:   Michele Mcalpine MD   Signed by:   Michele Mcalpine MD on 04/09/2010   Method used:   Print then Give to Patient   RxID:   [id number] SIMVASTATIN 40 MG  TABS (SIMVASTATIN) take as directed at bedtime...  #90 x 4   Entered and Authorized by:   Michele Mcalpine MD   Signed by:   Michele Mcalpine MD on 04/09/2010   Method used:   Print then Give to Patient   RxID:   [id number]    Immunization History:  Influenza Immunization History:    Influenza:  historical (01/25/2010)

## 2011-03-16 ENCOUNTER — Other Ambulatory Visit: Payer: Self-pay | Admitting: Obstetrics and Gynecology

## 2011-03-16 DIAGNOSIS — Z1231 Encounter for screening mammogram for malignant neoplasm of breast: Secondary | ICD-10-CM

## 2011-04-11 ENCOUNTER — Other Ambulatory Visit (INDEPENDENT_AMBULATORY_CARE_PROVIDER_SITE_OTHER): Payer: BC Managed Care – PPO

## 2011-04-11 ENCOUNTER — Ambulatory Visit (INDEPENDENT_AMBULATORY_CARE_PROVIDER_SITE_OTHER)
Admission: RE | Admit: 2011-04-11 | Discharge: 2011-04-11 | Disposition: A | Discharge status: Home / Self Care | Payer: BC Managed Care – PPO | Source: Ambulatory Visit | Attending: Pulmonary Disease | Admitting: Pulmonary Disease

## 2011-04-11 ENCOUNTER — Encounter: Payer: Self-pay | Admitting: Pulmonary Disease

## 2011-04-11 ENCOUNTER — Ambulatory Visit (INDEPENDENT_AMBULATORY_CARE_PROVIDER_SITE_OTHER): Payer: BC Managed Care – PPO | Admitting: Pulmonary Disease

## 2011-04-11 VITALS — BP 130/88 | HR 89 | Temp 96.8°F | Ht 60.0 in | Wt 169.2 lb

## 2011-04-11 DIAGNOSIS — Z0000 Encounter for general adult medical examination without abnormal findings: Secondary | ICD-10-CM

## 2011-04-11 DIAGNOSIS — E782 Mixed hyperlipidemia: Secondary | ICD-10-CM

## 2011-04-11 DIAGNOSIS — E559 Vitamin D deficiency, unspecified: Secondary | ICD-10-CM

## 2011-04-11 DIAGNOSIS — E282 Polycystic ovarian syndrome: Secondary | ICD-10-CM

## 2011-04-11 LAB — BASIC METABOLIC PANEL
BUN: 11 mg/dL (ref 6–23)
Chloride: 104 mEq/L (ref 96–112)
Creatinine, Ser: 0.5 mg/dL (ref 0.4–1.2)
Glucose, Bld: 81 mg/dL (ref 70–99)
Potassium: 3.8 mEq/L (ref 3.5–5.1)

## 2011-04-11 LAB — URINALYSIS, ROUTINE W REFLEX MICROSCOPIC
Bilirubin Urine: NEGATIVE
Ketones, ur: NEGATIVE
Leukocytes, UA: NEGATIVE
Specific Gravity, Urine: 1.02 (ref 1.000–1.030)
Urobilinogen, UA: 0.2 (ref 0.0–1.0)

## 2011-04-11 LAB — CBC WITH DIFFERENTIAL/PLATELET
Basophils Absolute: 0.1 10*3/uL (ref 0.0–0.1)
Eosinophils Absolute: 0 10*3/uL (ref 0.0–0.7)
HCT: 42.2 % (ref 36.0–46.0)
Lymphs Abs: 3 10*3/uL (ref 0.7–4.0)
Monocytes Relative: 4.9 % (ref 3.0–12.0)
Platelets: 278 10*3/uL (ref 150.0–400.0)
RDW: 13.5 % (ref 11.5–14.6)

## 2011-04-11 LAB — TSH: TSH: 1.67 u[IU]/mL (ref 0.35–5.50)

## 2011-04-11 LAB — HEPATIC FUNCTION PANEL
AST: 25 U/L (ref 0–37)
Albumin: 3.7 g/dL (ref 3.5–5.2)
Alkaline Phosphatase: 43 U/L (ref 39–117)
Total Protein: 7.1 g/dL (ref 6.0–8.3)

## 2011-04-11 LAB — LIPID PANEL
Cholesterol: 167 mg/dL (ref 0–200)
Total CHOL/HDL Ratio: 4
Triglycerides: 194 mg/dL — ABNORMAL HIGH (ref 0.0–149.0)

## 2011-04-11 MED ORDER — SERTRALINE HCL 100 MG PO TABS: ORAL_TABLET | ORAL | Status: DC

## 2011-04-11 MED ORDER — SIMVASTATIN 40 MG PO TABS: ORAL_TABLET | ORAL | Status: DC

## 2011-04-11 MED ORDER — SERTRALINE HCL 50 MG PO TABS: ORAL_TABLET | ORAL | Status: DC

## 2011-04-11 NOTE — Patient Instructions (Signed)
Today we updated your med list in our EPIC system...    Continue your current medications the same...    We refilled your meds per request...  We wrote a new prescription for a ZPak to keep on hand, as needed use...  Today we did your follow upCXR & fasting blood work...    Please call the PHONE TREE in a few days for your results...    Dial [id number] & when prompted enter your patient number followed by the # symbol...    Your patient number is:  [phone]  Let's get on track w/ our diet & exercise program...  Call for any questions...  Let's plan a similar physical in 1 year's time.Marland KitchenMarland Kitchen

## 2011-04-11 NOTE — Progress Notes (Signed)
Subjective:     Patient ID: Kristen [name], female   DOB: 1970-09-28, 41 y.o.   MRN: [id number]  HPI 41 y/o WF here for a yearly follow up and CPX... she has hypercholestrolemia treated w/ Simva20...  ~  March 27, 2009:  seen recently by TP w/ URI/ sinusitis requiring Augmentin, Pred, Mucinex, Hydromet cough syrup- finally better... otherw had a good yr- she decr the Simva40 to just 1/2 tab daily over the last 3-106mo (?to save $$)...  she is followed by [facility] for GYN w/ polycystic dis & HA w/ cycle- changed BCP to [facility] & improved... no other complaints or concerns- some incr stress w/ parents living w/ [place] ([place]  ~  April 09, 2010:  she's had a good yr but recent URI treated by Dentist w/ ZPak; but notes incr congestion, thick yellow sput, HA, etc... **we discussed rx w/ Augmentin, Depo/ Dosepak, Mucinex, Delsym, etc...  she has Hyperlipidemia & Chol OK on the Simva20 but needs better low fat diet;  she reports Vit D was low per GYN & placed on 50K weekly...  ~  April 11, 2011:  Yearly ROV & CPX> she's had a good yr but under incr stress w/ mother age63 s/p lumbar lam and grandmother age 74 w/ alz & fall w/ fx hip; DrHaygood gave her Zoloft50 & this is helping- requests 100mg  tabs to take 1/2 Qd...    Lipids have looked good in recent yrs on Simva20 + diet; labs today showed TChol 167, TG 194, HDL 45, LDL 83; rec same med better low fat diet...    Weight is up 13# to 169# today due to the stress & not eating right or exercising much; we discussed these issues & she will do better...    DrHaygood, Gyn has been adjusting BCP/ hormones to help her situation (PCOS etc)...    Notes return of Migraines and the hormonal adjustments help + MIDRIN is avail again & this works for her...    Pt tells me that [facility] also checks her Vit D levels & had her on 50K weekly...          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 ~  FLP 9/08 showed TChol 146, TG 143, HDL 48, LDL 70... Chol is good on Simva20. ~  FLP 12/09 showed TChol 159, TG 165, HDL 34, LDL 92... reminder- low fat diet... ~  FLP 1/11 showed TChol 182, TG 294, HDL 48, LDL 75... OK on Simva20, may need Fibrate. ~  FLP 1/12 showed TChol 141, TG 216, HDL 46,LDL 70... contin same w/ low fat diet. ~  FLP 1/13 on Simva20 showed TChol 167, TG 194, HDL 45, LDL 83... rec same med, better low fat diet...  Hx of MONONUCLEOSIS (ICD-075) - occured at age 1... no known residual.  POLYCYSTIC OVARIAN DISEASE (ICD-256.4) - followed by DrVHaygood who has her on BCPs/ hormones adjusted... ~  1/11:  [facility]/ [facility]... ~  1/13:  Recurrent Migraines & DrHaygood has adjusted meds & added MIDRIN w/ improvement...  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  Hx of FRACTURE, COCCYX (ICD-805.6)  VITAMIN D DEFICIENCY (ICD-268.9) - she tells me that her VitD level was low when checked by GYN & she's been  placed on 50,000 u weekly...  MIGRAINES >> MIDRIN really helps & is back on this med...   ANXIETY/ DEPRESSION >> started on Zoloft 50mg /d by [facility] in 2012; she reports that this is helping & pt requests Zoloft 100mg - take 1/2 qd...   Past Surgical History  Procedure Date  . Tonsillectomy and adenoidectomy     at age 51    Outpatient Encounter Prescriptions as of 04/11/2011  Medication Sig Dispense Refill  . aspirin 81 MG tablet Take 81 mg by mouth daily.      . fish oil-omega-3 fatty acids 1000 MG capsule Take 2 g by mouth daily.      Marland Kitchen isometheptene-acetaminophen-dichloralphenazone (MIDRIN) 65-325-100 MG capsule Take 1 capsule by mouth 4 (four) times daily as needed.      . norethindrone-ethinyl estradiol (NECON,BREVICON,MODICON) 0.5-35 MG-MCG tablet Take 1 tablet by mouth daily.        . sertraline (ZOLOFT) 50 MG tablet Take 50 mg by mouth daily.      . simvastatin (ZOCOR) 40 MG tablet Take 40 mg by mouth every evening.      . vitamin E 100 UNIT capsule Take 100 Units by mouth daily.      . Cholecalciferol (VITAMIN D3) 50000 UNITS CAPS Take 1 capsule by mouth once a week.      Marland Kitchen DISCONTD: guaiFENesin (MUCINEX) 600 MG 12 hr tablet Take 1,200 mg by mouth 2 (two) times daily.        No Known Allergies   Current Medications, Allergies, Past Medical History, Past Surgical History, Family History, and Social History were reviewed in [employer] record.    Review of Systems        The patient complains of headaches.  The patient denies fever, chills, sweats, anorexia, fatigue, weakness, malaise, weight loss, sleep disorder, blurring, diplopia, eye irritation, eye discharge, vision loss, eye pain, photophobia, earache, ear discharge, tinnitus, decreased hearing, nosebleeds, chest pain, palpitations, syncope, dyspnea on exertion, orthopnea, PND, peripheral edema, dyspnea at rest, excessive sputum, hemoptysis, wheezing, pleurisy, nausea, vomiting, diarrhea, constipation, change in bowel habits, abdominal pain, melena, hematochezia, jaundice, gas/bloating, indigestion/heartburn, dysphagia, odynophagia, dysuria, hematuria, urinary frequency, urinary hesitancy, nocturia, incontinence, back pain, joint pain, joint swelling, muscle cramps, muscle weakness, stiffness, arthritis, sciatica, restless legs, leg pain at night, leg pain with exertion, rash, itching, dryness, suspicious lesions, paralysis, paresthesias, seizures, tremors, vertigo, transient blindness, frequent falls, difficulty walking, depression, anxiety, memory loss, confusion, cold intolerance, heat intolerance, polydipsia, polyphagia, polyuria, unusual weight change, abnormal bruising, bleeding, enlarged lymph nodes, urticaria, allergic rash, hay fever, and recurrent infections.     Objective:   Physical  Exam    WD, WN, 41 y/o WF in NAD... GENERAL:  Alert & oriented; pleasant & cooperative... HEENT:  /AT, EOM-wnl, PERRLA, Fundi-benign, EACs-clear, TMs-wnl, NOSE-clear, THROAT-clear & wnl. NECK:  Supple w/ full ROM; no JVD; normal carotid impulses w/o bruits; no thyromegaly or nodules palpated; no lymphadenopathy. CHEST:  Clear to P & A; without wheezes/ rales/ or rhonchi. HEART:  Regular Rhythm; without murmurs/ rubs/ or gallops. ABDOMEN:  Soft & nontender; normal bowel sounds; no organomegaly or masses detected. EXT: without deformities or arthritic changes; no varicose veins/ venous insuffic/ or edema. NEURO:  CN's intact; motor testing normal; sensory testing normal; gait normal & balance OK. DERM:  No lesions noted; no rash etc...  RADIOLOGY DATA:  Reviewed in the EPIC EMR & discussed w/ the patient...    >>CXR 1/13 showed normal heart size, clear lungs, WNL...    >>  EKG showed NSR, rate88, WNL, NAD...  LABORATORY DATA:  Reviewed in the EPIC EMR & discussed w/ the patient...   Assessment:     CPX>>  Hyperlipidemia>  Chol looks good on Simva20; TG sl elev & rec for better low fat diet...  Polycystic Ovarian dis>  Managed by [facility] on hormone therapy...  Vit D Defic>  Vit D levels [facility] per pt...  HEADACHES>  Improved w/ hormone adjustm,ent & w/ MIDRIN for prn use...  Anxiety/ Depression>  Improved on Zoloft50, continue same...     Plan:     Patient's Medications  New Prescriptions   SERTRALINE (ZOLOFT) 100 MG TABLET    TAKE AS DIRECTED  Previous Medications   ASPIRIN 81 MG TABLET    Take 81 mg by mouth daily.   CHOLECALCIFEROL (VITAMIN D3) 50000 UNITS CAPS    Take 1 capsule by mouth once a week.   FISH OIL-OMEGA-3 FATTY ACIDS 1000 MG CAPSULE    Take 2 g by mouth daily.   ISOMETHEPTENE-ACETAMINOPHEN-DICHLORALPHENAZONE (MIDRIN) 65-325-100 MG CAPSULE    Take 1 capsule by mouth 4 (four) times daily as needed.   NORETHINDRONE-ETHINYL ESTRADIOL  (NECON,BREVICON,MODICON) 0.5-35 MG-MCG TABLET    Take 1 tablet by mouth daily.   VITAMIN E 100 UNIT CAPSULE    Take 100 Units by mouth daily.  Modified Medications   Modified Medication Previous Medication   SIMVASTATIN (ZOCOR) 40 MG TABLET simvastatin (ZOCOR) 40 MG tablet      TAKE AS DIRECTED    Take 40 mg by mouth every evening.  Discontinued Medications   GUAIFENESIN (MUCINEX) 600 MG 12 HR TABLET    Take 1,200 mg by mouth 2 (two) times daily.   SERTRALINE (ZOLOFT) 50 MG TABLET    Take 50 mg by mouth daily.

## 2011-04-12 ENCOUNTER — Encounter: Payer: Self-pay | Admitting: Pulmonary Disease

## 2011-04-22 ENCOUNTER — Ambulatory Visit: Payer: Self-pay

## 2011-05-30 ENCOUNTER — Ambulatory Visit
Admission: RE | Admit: 2011-05-30 | Discharge: 2011-05-30 | Disposition: A | Discharge status: Home / Self Care | Payer: BC Managed Care – PPO | Source: Ambulatory Visit | Attending: Obstetrics and Gynecology | Admitting: Obstetrics and Gynecology

## 2011-05-30 DIAGNOSIS — Z1231 Encounter for screening mammogram for malignant neoplasm of breast: Secondary | ICD-10-CM

## 2011-06-15 ENCOUNTER — Telehealth: Payer: Self-pay | Admitting: Pulmonary Disease

## 2011-06-15 MED ORDER — TRAMADOL HCL 50 MG PO TABS: 50.0000 mg | ORAL_TABLET | Freq: Three times a day (TID) | ORAL | Status: DC | PRN

## 2011-06-15 NOTE — Telephone Encounter (Signed)
Per SN--if these are not working she will need Headache clinic eval---send in referal for this and call in tramadol 50mg   # 90  1 po tid prn pain.  Thanks.

## 2011-06-15 NOTE — Telephone Encounter (Signed)
I spoke with pt and she states the midrin is not helping with her migraines. She states she is taking the maximum dose and does not want to take it any longer. She has already tried OTC meds w/o relief. She is wanting to know if SN has any further recs. Please advise SN thanks  No Known Allergies

## 2011-06-15 NOTE — Telephone Encounter (Signed)
Spoke with pt and notified of recs per SN. Pt verbalized understanding. Order was sent to [facility] and rx for tramadol sent.

## 2011-07-11 ENCOUNTER — Other Ambulatory Visit: Payer: Self-pay

## 2011-07-11 ENCOUNTER — Encounter: Payer: Self-pay | Admitting: Family Medicine

## 2011-07-11 ENCOUNTER — Ambulatory Visit (INDEPENDENT_AMBULATORY_CARE_PROVIDER_SITE_OTHER): Payer: BC Managed Care – PPO | Admitting: Family Medicine

## 2011-07-11 VITALS — BP 118/81 | HR 83 | Temp 98.5°F | Ht 60.0 in | Wt 165.0 lb

## 2011-07-11 DIAGNOSIS — M2140 Flat foot [pes planus] (acquired), unspecified foot: Secondary | ICD-10-CM

## 2011-07-11 DIAGNOSIS — M549 Dorsalgia, unspecified: Secondary | ICD-10-CM | POA: Insufficient documentation

## 2011-07-11 DIAGNOSIS — M7061 Trochanteric bursitis, right hip: Secondary | ICD-10-CM

## 2011-07-11 DIAGNOSIS — M76899 Other specified enthesopathies of unspecified lower limb, excluding foot: Secondary | ICD-10-CM

## 2011-07-11 DIAGNOSIS — M722 Plantar fascial fibromatosis: Secondary | ICD-10-CM

## 2011-07-11 MED ORDER — HYDROCODONE-ACETAMINOPHEN 5-500 MG PO TABS: 1.0000 | ORAL_TABLET | Freq: Three times a day (TID) | ORAL | Status: AC | PRN

## 2011-07-11 MED ORDER — TRAMADOL HCL 50 MG PO TABS: 50.0000 mg | ORAL_TABLET | Freq: Three times a day (TID) | ORAL | Status: DC | PRN

## 2011-07-11 NOTE — Progress Notes (Signed)
Subjective:    Patient ID: Kristen [name], female    DOB: [birth year], 41 y.o.   MRN: [id number]  HPI 41 year old female here to establish care mostly for manipulation treatment. Patient has a one-week history of acute low back pain. Patient states right-sided. Mechanism of injury was patient was going down to put socks in her daughter's drawer and felt a tight twinge. Patient states that it started in the mid back on the right side radiating down the right leg of the anterior hip. Denies radiation to the past her knee denies any numbness or weakness in the leg. Patient had been taking some muscle relaxants and her mother's oxycodone to try to help with the pain. Patient states it has gradually improved over the course of the week but still unable to do such things as take stairs or even walk regularly. Patient is attempting to lose weight is making it difficult now secondary to the pain and unable to do physical exercise. Denies fevers chills nausea vomiting or dysuria.  Past Medical History  Diagnosis Date  . Mixed hyperlipidemia   . History of mononucleosis   . Polycystic ovarian disease   . Plantar fasciitis   . Fracture of coccyx   . Vitamin d deficiency    Past Surgical History  Procedure Date  . Tonsillectomy and adenoidectomy     at age 52   History   Social History  . Marital Status: Married    Spouse Name: Casimiro Needle    Number of Children: 2  . Years of Education: N/A   Occupational History  . dental hygenist   .     Social History Main Topics  . Smoking status: Never Smoker   . Smokeless tobacco: Not on file  . Alcohol Use: Yes     rare use  . Drug Use: Not on file  . Sexually Active: Not on file   Other Topics Concern  . Not on file   Social History Narrative  . No narrative on file     Review of Systems  Denies fevers chills nausea vomiting or dysuria. Denies diarrhea constipation.     Objective:   Physical Exam  Constitutional: She is oriented to  person, place, and time. She appears well-developed and well-nourished.  Eyes: Pupils are equal, round, and reactive to light.  Neck: Normal range of motion. No thyromegaly present.  Cardiovascular: Normal rate, regular rhythm and normal heart sounds.   Pulmonary/Chest: Effort normal and breath sounds normal.  Abdominal: Soft. Bowel sounds are normal.  Musculoskeletal: Normal range of motion.       Right shoulder: She exhibits normal range of motion, no swelling and no effusion.  Neurological: She is alert and oriented to person, place, and time. She has normal reflexes.  Skin: Skin is warm.   vitals reviewed Patient back exam: Normal The patient does have some mild paraspinal tenderness on the right side in the lumbar region. Patient has no spinous process tenderness. Patient does have significant clear trochanteric bursitis pain. Negative straight leg test bilaterally negative Faber test bilaterally Foot exam pes planus bilaterally with overpronation bilaterally left greater than right    Assessment & Plan:  Back pain Acute strain and is gradually improving with time. Given exercises and encouraged core strengthening exercises. Patient though compensated and it causes him to have a greater trochanteric bursitis. Followup 3-4 weeks Tramadol and hydrocodone as needed.  Greater trochanteric bursitis of right hip Procedure note After verbal and written consent given pt  was prepped with betadine.  1:3 kenalog 40 to lidocaine used in greater trochanteric bursitis right-sided.  Pt minimal bleeding dressed with band aid, Pt given red flags to look for pt had better pain control immediatly.   Given stretches to do Followup 3-4 weeks. Discussed orthotics and proper shoe wear to help with proper hip alignment.   Pes planus Patient given arch supports Told to get Spenco orthotics Followup 3-4 weeks. If fails we'll need to consider doing custom orthotics. Discussed proper shoe wear.

## 2011-07-11 NOTE — Assessment & Plan Note (Signed)
Acute strain and is gradually improving with time. Given exercises and encouraged core strengthening exercises. Patient though compensated and it causes him to have a greater trochanteric bursitis. Followup 3-4 weeks Tramadol and hydrocodone as needed.

## 2011-07-11 NOTE — Assessment & Plan Note (Signed)
Procedure note After verbal and written consent given pt was prepped with betadine.  1:3 kenalog 40 to lidocaine used in greater trochanteric bursitis right-sided.  Pt minimal bleeding dressed with band aid, Pt given red flags to look for pt had better pain control immediatly.   Given stretches to do Followup 3-4 weeks. Discussed orthotics and proper shoe wear to help with proper hip alignment.

## 2011-07-11 NOTE — Patient Instructions (Signed)
It is good to see you. You had a greater trochanteric bursitis. I injected her hip today which will hopefully help. I am giving you some arch bandages for your feet which will hopefully help. I when she to pick up some Spenco orthotics at [facility]. You should make an appointment with me in 3-4 weeks to make sure were doing better. Trochanteric Bursitis You have hip pain due to trochanteric bursitis. Bursitis means that the sack near the outside of the hip is filled with fluid and inflamed. This sack is made up of protective soft tissue. The pain from trochanteric bursitis can be severe and keep you from sleep. It can radiate to the buttocks or down the outside of the thigh to the knee. The pain is almost always worse when rising from the seated or lying position and with walking. Pain can improve after you take a few steps. It happens more often in people with hip joint and lumbar spine problems, such as arthritis or previous surgery. Very rarely the trochanteric bursa can become infected, and antibiotics and/or surgery may be needed. Treatment often includes an injection of local anesthetic mixed with cortisone medicine. This medicine is injected into the area where it is most tender over the hip. Repeat injections may be necessary if the response to treatment is slow. You can apply ice packs over the tender area for 30 minutes every 2 hours for the next few days. Anti-inflammatory and/or narcotic pain medicine may also be helpful. Limit your activity for the next few days if the pain continues. See your caregiver in 5-10 days if you are not greatly improved.  SEEK IMMEDIATE MEDICAL CARE IF:  You develop severe pain, fever, or increased redness.   You have pain that radiates below the knee.  EXERCISES STRETCHING EXERCISES - Trochantic Bursitis  These exercises may help you when beginning to rehabilitate your injury. Your symptoms may resolve with or without further involvement from your  physician, physical therapist or athletic trainer. While completing these exercises, remember:   Restoring tissue flexibility helps normal motion to return to the joints. This allows healthier, less painful movement and activity.   An effective stretch should be held for at least 30 seconds.   A stretch should never be painful. You should only feel a gentle lengthening or release in the stretched tissue.  STRETCH - Iliotibial Band  On the floor or bed, lie on your side so your injured leg is on top. Bend your knee and grab your ankle.   Slowly bring your knee back so that your thigh is in line with your trunk. Keep your heel at your buttocks and gently arch your back so your head, shoulders and hips line up.   Slowly lower your leg so that your knee approaches the floor/bed until you feel a gentle stretch on the outside of your thigh. If you do not feel a stretch and your knee will not fall farther, place the heel of your opposite foot on top of your knee and pull your thigh down farther.   Hold this stretch for __________ seconds.   Repeat __________ times. Complete this exercise __________ times per day.  STRETCH - Hamstrings, Supine   Lie on your back. Loop a belt or towel over the ball of your foot as shown.   Straighten your knee and slowly pull on the belt to raise your injured leg. Do not allow the knee to bend. Keep your opposite leg flat on the floor.  Raise the leg until you feel a gentle stretch behind your knee or thigh. Hold this position for __________ seconds.   Repeat __________ times. Complete this stretch __________ times per day.  STRETCH - Quadriceps, Prone   Lie on your stomach on a firm surface, such as a bed or padded floor.   Bend your knee and grasp your ankle. If you are unable to reach, your ankle or pant leg, use a belt around your foot to lengthen your reach.   Gently pull your heel toward your buttocks. Your knee should not slide out to the side. You  should feel a stretch in the front of your thigh and/or knee.   Hold this position for __________ seconds.   Repeat __________ times. Complete this stretch __________ times per day.  STRETCHING - Hip Flexors, Lunge Half kneel with your knee on the floor and your opposite knee bent and directly over your ankle.  Keep good posture with your head over your shoulders. Tighten your buttocks to point your tailbone downward; this will prevent your back from arching too much.   You should feel a gentle stretch in the front of your thigh and/or hip. If you do not feel any resistance, slightly slide your opposite foot forward and then slowly lunge forward so your knee once again lines up over your ankle. Be sure your tailbone remains pointed downward.   Hold this stretch for __________ seconds.   Repeat __________ times. Complete this stretch __________ times per day.  STRETCH - Adductors, Lunge  While standing, spread your legs   Lean away from your injured leg by bending your opposite knee. You may rest your hands on your thigh for balance.   You should feel a stretch in your inner thigh. Hold for __________ seconds.   Repeat __________ times. Complete this exercise __________ times per day.  Document Released: 04/07/2004 Document Revised: 02/17/2011 Document Reviewed: 06/12/2008 [facility] Patient Information 2012 [place], [place].

## 2011-07-11 NOTE — Assessment & Plan Note (Signed)
Patient given arch supports Told to get Spenco orthotics Followup 3-4 weeks. If fails we'll need to consider doing custom orthotics. Discussed proper shoe wear.

## 2011-07-22 ENCOUNTER — Other Ambulatory Visit: Payer: BC Managed Care – PPO

## 2011-07-22 DIAGNOSIS — E569 Vitamin deficiency, unspecified: Secondary | ICD-10-CM

## 2011-08-05 ENCOUNTER — Ambulatory Visit: Payer: BC Managed Care – PPO | Admitting: Family Medicine

## 2011-08-09 ENCOUNTER — Encounter: Payer: Self-pay | Admitting: Family Medicine

## 2011-08-09 ENCOUNTER — Ambulatory Visit (INDEPENDENT_AMBULATORY_CARE_PROVIDER_SITE_OTHER): Payer: BC Managed Care – PPO | Admitting: Family Medicine

## 2011-08-09 VITALS — BP 128/89 | HR 90 | Temp 98.3°F | Ht 60.0 in | Wt 164.0 lb

## 2011-08-09 DIAGNOSIS — M2140 Flat foot [pes planus] (acquired), unspecified foot: Secondary | ICD-10-CM

## 2011-08-09 DIAGNOSIS — M7061 Trochanteric bursitis, right hip: Secondary | ICD-10-CM

## 2011-08-09 DIAGNOSIS — M76899 Other specified enthesopathies of unspecified lower limb, excluding foot: Secondary | ICD-10-CM

## 2011-08-10 NOTE — Progress Notes (Signed)
Patient ID: Kristen [name], female   DOB: 1971-02-13, 41 y.o.   MRN: [id number]  Subjective:    Patient ID: Kristen [name], female    DOB: Jun 01, 1970, 41 y.o.   MRN: [id number]  HPI 41 year old female here for followup of greater trochanteric bursitis.patient since the last time a senior has had minimal pain on that side. Patient did receive an injection in has been given exercises to do a regular basis. Patient states she does her stretches as well as some hip abduction exercises which has improved the pain tremendously. Patient also given some arch supports which she wears on a daily basis and wears tennis shoes. Patient states that only minimal pain from time to time but in does not cause her to stop her activities of daily living. Denies radiation to the past her knee denies any numbness or weakness in the leg. Denies fevers chills nausea vomiting or dysuria.    Review of Systems  Denies fevers chills nausea vomiting or dysuria. Denies diarrhea constipation.     Objective:   Physical Exam  Constitutional: She is oriented to person, place, and time. She appears well-developed and well-nourished.  Eyes: Pupils are equal, round, and reactive to light.  Neck: Normal range of motion. No thyromegaly present.  Cardiovascular: Normal rate, regular rhythm and normal heart sounds.   Pulmonary/Chest: Effort normal and breath sounds normal.  Abdominal: Soft. Bowel sounds are normal.  Neurological: She is alert and oriented to person, place, and time. She has normal reflexes.  Skin: Skin is warm.   vitals reviewed Patient back exam: Normal Patient's hip a right-sided full range of motion nontender to palpation negative straight leg and negative Faber's test. Foot exam pes planus bilaterally with overpronation bilaterally left greater than right    Assessment & Plan:  No problem-specific assessment & plan notes found for this encounter.

## 2011-08-10 NOTE — Patient Instructions (Signed)
Patient given verbal instructions 

## 2011-08-10 NOTE — Assessment & Plan Note (Signed)
Resolved at this time. Encourage patient to continue exercises, strength and core, followup as needed.

## 2011-08-10 NOTE — Assessment & Plan Note (Signed)
Improving significantly at this time. Encourage patient to continue exercises as well as arch supports. Followup as needed.

## 2011-08-18 ENCOUNTER — Telehealth: Payer: Self-pay | Admitting: Obstetrics and Gynecology

## 2011-08-18 NOTE — Telephone Encounter (Signed)
Tc to pt per telephone call. Told pt Vit D-43. Pt voices understanding. Informed pt needs daily dose of calcium 1200mg  and Vit D [id number] IU daily. Pt agrees.

## 2011-08-18 NOTE — Telephone Encounter (Signed)
Chandra/elect. lab

## 2011-11-08 ENCOUNTER — Ambulatory Visit (INDEPENDENT_AMBULATORY_CARE_PROVIDER_SITE_OTHER): Payer: BC Managed Care – PPO | Admitting: Family Medicine

## 2011-11-08 ENCOUNTER — Encounter: Payer: Self-pay | Admitting: Family Medicine

## 2011-11-08 VITALS — BP 142/91 | HR 97 | Ht 60.0 in | Wt 158.0 lb

## 2011-11-08 DIAGNOSIS — R0989 Other specified symptoms and signs involving the circulatory and respiratory systems: Secondary | ICD-10-CM

## 2011-11-08 DIAGNOSIS — M9908 Segmental and somatic dysfunction of rib cage: Secondary | ICD-10-CM

## 2011-11-08 MED ORDER — MELOXICAM 15 MG PO TABS: 15.0000 mg | ORAL_TABLET | Freq: Every day | ORAL | Status: DC

## 2011-11-08 MED ORDER — CYCLOBENZAPRINE HCL 10 MG PO TABS: 10.0000 mg | ORAL_TABLET | Freq: Every evening | ORAL | Status: AC | PRN

## 2011-11-08 NOTE — Assessment & Plan Note (Signed)
Decision today to treat with OMT was based on Physical Exam  After verbal consent patient was treated with FPR techniques in 1st rib areas  Patient tolerated the procedure well with improvement in symptoms  Patient given exercises, stretches and lifestyle modifications including sleeping position.   See medications in patient instructions if given  Patient will follow up in 3 weeks weeks

## 2011-11-08 NOTE — Patient Instructions (Signed)
So wonderful to see you! I am giving you two medicines Take 1 pill daily of the meloxciam and then the flexeril as needed.  Do the exercises and then come and see me again in 3-4 weeks if you need me.

## 2011-11-08 NOTE — Progress Notes (Signed)
41 yo female here for right shoulder pain.  Patient states it started about 1 month ago and has gradual seemed to worsen. Patient remembers the initial pain when she woke up at night while sleeping on that side. Patient does not remember any injury and has not change any of her daily activities. .  Patient states it hurts in certain movements, but denies much radiation of pain or any numbness in extremities.  Patient though states that the longer this has occurred though them more the symptoms of pain seemed to be traveling further down the arm then the shoulder.  ROS as stated above HPI  Past medical history, social, surgical and family history all reviewed.   Physical Exam: Gen: NAD A&Ox3 Neck: Negative spurling's Full neck range of motion Grip strength and sensation normal in bilateral hands Strength good C4 to T1 distribution No sensory change to C4 to T1 Reflexes normal Pt though states some pain over the right 1st rib being the most tender part of exam.  Negative steel syndrome.  Shoulder: Inspection reveals no abnormalities, atrophy or asymmetry. Palpation is normal with no tenderness over AC joint or bicipital groove. ROM is full in all planes. Rotator cuff strength normal throughout. No signs of impingement with negative Neer and Hawkin's tests, empty can. Speeds and Yergason's tests is positive but pain over 1st rib . No labral pathology noted with negative Obrien's, negative clunk and good stability. Normal scapular function observed. No painful arc and no drop arm sign. No apprehension sign

## 2011-12-13 ENCOUNTER — Ambulatory Visit: Payer: BC Managed Care – PPO | Admitting: Family Medicine

## 2011-12-27 ENCOUNTER — Ambulatory Visit (INDEPENDENT_AMBULATORY_CARE_PROVIDER_SITE_OTHER): Payer: BC Managed Care – PPO | Admitting: Family Medicine

## 2011-12-27 VITALS — BP 130/80 | Ht 60.0 in | Wt 140.0 lb

## 2011-12-27 DIAGNOSIS — R0989 Other specified symptoms and signs involving the circulatory and respiratory systems: Secondary | ICD-10-CM

## 2011-12-27 DIAGNOSIS — M999 Biomechanical lesion, unspecified: Secondary | ICD-10-CM | POA: Insufficient documentation

## 2011-12-27 DIAGNOSIS — M9908 Segmental and somatic dysfunction of rib cage: Secondary | ICD-10-CM

## 2011-12-27 NOTE — Assessment & Plan Note (Signed)
Improved at this time. Patient is doing the exercises and is tolerating them well as well. Patient did have OMT manipulation today and had improvement. Encourage patient to continue the same treatment at this time. Patient will continue to see me on an as-needed basis.

## 2011-12-27 NOTE — Assessment & Plan Note (Signed)
Decision today to treat with OMT was based on Physical Exam  After verbal consent patient was treated with HVLA techniques in thoracic, lumbar and sacral and rib areas  Patient tolerated the procedure well with improvement in symptoms  Patient given exercises, stretches and lifestyle modifications  See medications in patient instructions if given  Patient will follow up as needed

## 2011-12-27 NOTE — Progress Notes (Signed)
41 yo female here for right shoulder pain.  Patient is here for followup of right shoulder pain. Patient had an elevated first rib at last visit that responded very well to manipulation. Patient also given exercises to strengthen rotator cuff which has been significantly beneficial. Patient states that she is 80-85% better. Patient is able to do all her activities of daily living without any trouble. Patient denies any radiation of pain any numbness or loss of strength.  ROS as stated above HPI  Past medical history, social, surgical and family history all reviewed.   Physical Exam: Blood pressure 130/80, height 5' (1.524 m), weight 140 lb (63.504 kg).  Gen: NAD A&Ox3 Neck: Negative spurling's Full neck range of motion Grip strength and sensation normal in bilateral hands Strength good C4 to T1 distribution No sensory change to C4 to T1 Reflexes normal Negative steel syndrome.  Shoulder: Inspection reveals no abnormalities, atrophy or asymmetry. Palpation is normal with no tenderness over AC joint or bicipital groove. ROM is full in all planes. Rotator cuff strength normal throughout. No signs of impingement with negative Neer and Hawkin's tests, empty can. Speeds and Yergason's tests is positive but pain over 1st rib . No labral pathology noted with negative Obrien's, negative clunk and good stability. Normal scapular function observed. No painful arc and no drop arm sign. No apprehension sign  OMT Physical Exam   Cervical  C 5 F RS right  Thoracic T 1 E RS right  T5 E RS left  Lumbar L2 F RS right  Sacrum Left on left  Illium Neutral.

## 2012-02-15 ENCOUNTER — Telehealth: Payer: Self-pay | Admitting: Pulmonary Disease

## 2012-02-15 NOTE — Telephone Encounter (Signed)
LMTCB

## 2012-02-16 NOTE — Telephone Encounter (Signed)
Pt returned call. Kristen [name] °

## 2012-02-16 NOTE — Telephone Encounter (Signed)
LMOMTCB x 1 

## 2012-02-16 NOTE — Telephone Encounter (Signed)
The pt would like to know if Kristen [name] would take over prescribing her Topiramate. She has been seeing Dr. Neale Burly every 3 months but this has become costly with $50 co pays each time. She states that if her headaches/migraines become worse she would go back to see Dr. Neale Burly for follow-up. Kristen [name] pls advise.No Known Allergies    Last OV 03/2011   Pending OV 04/13/2012

## 2012-02-16 NOTE — Telephone Encounter (Signed)
Pt returned call.  Holly D Pryor ° °

## 2012-02-16 NOTE — Telephone Encounter (Signed)
Sorry, closed note in error.

## 2012-02-16 NOTE — Telephone Encounter (Signed)
LMOMTCB x 1 for the pt. 

## 2012-02-21 ENCOUNTER — Telehealth: Payer: Self-pay | Admitting: Pulmonary Disease

## 2012-02-21 MED ORDER — TOPIRAMATE 100 MG PO TABS: 100.0000 mg | ORAL_TABLET | Freq: Every day | ORAL | Status: DC

## 2012-02-21 NOTE — Addendum Note (Signed)
Addended by: Caryl Ada on: 02/21/2012 02:39 PM   Modules accepted: Orders

## 2012-02-21 NOTE — Telephone Encounter (Signed)
note closed in error       Reason For Call History Recorded         Call Documentation     Michel Bickers, [facility]  02/16/2012  3:14 PM  Signed Sorry, closed note in error.  Michel Bickers, [facility]  02/16/2012  3:13 PM  Signed The pt would like to know if SN would take over prescribing her Topiramate. She has been seeing Dr. Neale Burly every 3 months but this has become costly with $50 co pays each time. She states that if her headaches/migraines become worse she would go back to see Dr. Neale Burly for follow-up. SN pls advise.No Known Allergies     Last OV 03/2011   Pending OV 04/13/2012  Antionette Fairy  02/16/2012 12:12 PM  Signed Pt returned call.  Antionette Fairy     [place], [place]  02/16/2012 12:00 PM  Signed LMOMTCB x 1  Leanora Ivanoff  02/16/2012 11:09 AM  Signed Pt returned call Bernarda Caffey, CMA  02/16/2012 10:58 AM  Signed LMOMTCB x 1 for the pt.  Vernie Murders, CMA  02/15/2012  4:02 PM  Signed LMTCB       Encounter MyChart Messages     No messages in this encounter      Routing History       Priority Sent On From To Message Type      02/16/2012  3:15 PM Gwynneth Albright, CMA Marcellus Scott, CMA Patient Calls      02/16/2012 12:12 PM Antionette Fairy [facility] Triage Pool Patient Calls      02/16/2012 11:09 AM Leanora Ivanoff [facility] Triage Pool Patient Calls      02/15/2012 11:55 AM Hazel Sams [facility] Triage Pool Patient Calls     Created by     Hazel Sams on 02/15/2012 11:53 AM            Visit Pharmacy     [id number] - [place], [place] - [address] [facility] [address] [facility] [address]      Contacts         Type Contact Phone    02/15/2012 11:53 AM Phone (Incoming) Delle Reining A (Self) [phone] (H)     pt wants rx called in for headaches / migraines- she says dr Neale Burly previously prescribed this- she wants sn to prescribe now since she sees him for physicals- rx for TOPIRAMATE - [facility] [address]

## 2012-02-21 NOTE — Telephone Encounter (Signed)
This has been taken care of (see other telephone encounter) and she has been contacted about this.

## 2012-02-21 NOTE — Telephone Encounter (Signed)
Per SN----yes ok to call in refills of the topiramate.  thanks

## 2012-02-21 NOTE — Telephone Encounter (Signed)
Rx has been sent in, pt aware. 

## 2012-03-28 ENCOUNTER — Ambulatory Visit: Payer: BC Managed Care – PPO | Admitting: Obstetrics and Gynecology

## 2012-03-28 ENCOUNTER — Encounter: Payer: Self-pay | Admitting: Obstetrics and Gynecology

## 2012-03-28 VITALS — BP 130/82 | HR 96 | Ht 61.0 in | Wt 176.0 lb

## 2012-03-28 DIAGNOSIS — E282 Polycystic ovarian syndrome: Secondary | ICD-10-CM

## 2012-03-28 DIAGNOSIS — E559 Vitamin D deficiency, unspecified: Secondary | ICD-10-CM

## 2012-03-28 NOTE — Progress Notes (Signed)
Subjective:  Kristen [name]  Last Pap: 03/28/11 WNL: Yes with nl HR HPV Regular Periods:yes q 12 weeks Contraception: pill-necon  Monthly Breast exam:no Tetanus<2yrs:no pt unsure Nl.Bladder Function: no stress incontinence  Daily BMs:yes Healthy Diet:no Calcium:no Mammogram:yes Date of Mammogram: 05/31/11 Exercise:no Have often Exercise: n/a Seatbelt: yes Abuse at home: no Stressful work:no Sigmoid-colonoscopy: n/a Bone Density: No PCP: Dr. Alroy Dust Change in PMH: no changes.  Has started Topamax for prophylaxis and has fewer headaches Change in [facility]: no changes  Kristen [name] is a 41 y.o. female, G2P2, who presents for an annual exam. She is followed for PCOS and migraines, both responsive to extended cycle BCPs.  Has hx of low vit D    History   Social History  . Marital Status: Married    Spouse Name: Casimiro Needle    Number of Children: 2  . Years of Education: N/A   Occupational History  . dental hygenist   .     Social History Main Topics  . Smoking status: Never Smoker   . Smokeless tobacco: Never Used  . Alcohol Use: Yes     Comment: rare use  . Drug Use: No  . Sexually Active: None     Comment: necon   Other Topics Concern  . None   Social History Narrative  . None    Menstrual cycle:   LMP: Patient's last menstrual period was 03/06/2012.           Cycle: q3 mos on extended cycle BCPs  The following portions of the patient's history were reviewed and updated as appropriate: allergies, current medications, past family history, past medical history, past social history, past surgical history and problem list.  Review of Systems Pertinent items are noted in HPI. Breast:Negative for breast lump,nipple discharge or nipple retraction Gastrointestinal: Negative for abdominal pain, change in bowel habits or rectal bleeding Urinary:negative   Objective:    BP 130/82  Pulse 96  Ht 5\' 1"  (1.549 m)  Wt 176 lb (79.833 kg)  BMI 33.25 kg/m2  LMP 03/06/2012    Weight:  Wt Readings from Last 1 Encounters:  03/28/12 176 lb (79.833 kg)          BMI: Body mass index is 33.25 kg/(m^2).  General Appearance: Alert, appropriate appearance for age. No acute distress HEENT: Grossly normal Neck / Thyroid: Supple, no masses, nodes or enlargement Lungs: clear to auscultation bilaterally Back: No CVA tenderness Breast Exam: No masses or nodes.No dimpling, nipple retraction or discharge. Cardiovascular: Regular rate and rhythm. S1, S2, no murmur Gastrointestinal: Soft, non-tender, no masses or organomegaly Pelvic Exam: Vulva and vagina appear normal. Bimanual exam reveals normal uterus and adnexa. Rectovaginal: normal rectal, no masses Lymphatic Exam: Non-palpable nodes in neck, clavicular, axillary, or inguinal regions Skin: no rash or abnormalities Neurologic: Normal gait and speech, no tremor  Psychiatric: Alert and oriented, appropriate affect.   Wet Prep:not applicable Urinalysis:not applicable UPT: Not done   Assessment:    PCOS well controlled  Vit D insufficiency   Plan:    mammogram pap smear due 2016 return annually or prn STD screening: declined Contraception:oral contraceptives (estrogen/progesterone) Vit D level   Dierdre Forth MD

## 2012-03-30 ENCOUNTER — Other Ambulatory Visit: Payer: Self-pay

## 2012-03-30 MED ORDER — ERGOCALCIFEROL 1.25 MG (50000 UT) PO CAPS: 50000.0000 [IU] | ORAL_CAPSULE | ORAL | Status: DC

## 2012-04-13 ENCOUNTER — Other Ambulatory Visit (INDEPENDENT_AMBULATORY_CARE_PROVIDER_SITE_OTHER): Payer: BC Managed Care – PPO

## 2012-04-13 ENCOUNTER — Ambulatory Visit (INDEPENDENT_AMBULATORY_CARE_PROVIDER_SITE_OTHER): Payer: BC Managed Care – PPO | Admitting: Pulmonary Disease

## 2012-04-13 ENCOUNTER — Ambulatory Visit: Payer: BC Managed Care – PPO | Admitting: Pulmonary Disease

## 2012-04-13 ENCOUNTER — Encounter: Payer: Self-pay | Admitting: Pulmonary Disease

## 2012-04-13 VITALS — BP 122/86 | HR 76 | Temp 97.4°F | Ht 60.0 in | Wt 175.2 lb

## 2012-04-13 DIAGNOSIS — E782 Mixed hyperlipidemia: Secondary | ICD-10-CM

## 2012-04-13 DIAGNOSIS — Z0000 Encounter for general adult medical examination without abnormal findings: Secondary | ICD-10-CM

## 2012-04-13 DIAGNOSIS — G43909 Migraine, unspecified, not intractable, without status migrainosus: Secondary | ICD-10-CM

## 2012-04-13 DIAGNOSIS — E282 Polycystic ovarian syndrome: Secondary | ICD-10-CM

## 2012-04-13 DIAGNOSIS — E559 Vitamin D deficiency, unspecified: Secondary | ICD-10-CM

## 2012-04-13 LAB — CBC WITH DIFFERENTIAL/PLATELET
Basophils Relative: 0.3 % (ref 0.0–3.0)
Eosinophils Absolute: 0 10*3/uL (ref 0.0–0.7)
Eosinophils Relative: 0 % (ref 0.0–5.0)
Hemoglobin: 14.6 g/dL (ref 12.0–15.0)
Lymphocytes Relative: 29.4 % (ref 12.0–46.0)
Monocytes Relative: 5 % (ref 3.0–12.0)
Neutrophils Relative %: 65.3 % (ref 43.0–77.0)
RBC: 4.72 Mil/uL (ref 3.87–5.11)
WBC: 10.8 10*3/uL — ABNORMAL HIGH (ref 4.5–10.5)

## 2012-04-13 LAB — BASIC METABOLIC PANEL
BUN: 13 mg/dL (ref 6–23)
CO2: 21 mEq/L (ref 19–32)
Chloride: 106 mEq/L (ref 96–112)
Creatinine, Ser: 0.6 mg/dL (ref 0.4–1.2)
Glucose, Bld: 84 mg/dL (ref 70–99)

## 2012-04-13 LAB — URINALYSIS, ROUTINE W REFLEX MICROSCOPIC
Nitrite: NEGATIVE
Specific Gravity, Urine: 1.02 (ref 1.000–1.030)
Total Protein, Urine: NEGATIVE
pH: 7.5 (ref 5.0–8.0)

## 2012-04-13 LAB — HEPATIC FUNCTION PANEL
ALT: 22 U/L (ref 0–35)
Albumin: 3.7 g/dL (ref 3.5–5.2)
Total Protein: 7 g/dL (ref 6.0–8.3)

## 2012-04-13 LAB — LIPID PANEL
Cholesterol: 151 mg/dL (ref 0–200)
Total CHOL/HDL Ratio: 5

## 2012-04-13 LAB — LDL CHOLESTEROL, DIRECT: Direct LDL: 70.2 mg/dL

## 2012-04-13 MED ORDER — SIMVASTATIN 40 MG PO TABS: ORAL_TABLET | ORAL | Status: DC

## 2012-04-13 MED ORDER — SERTRALINE HCL 100 MG PO TABS: ORAL_TABLET | ORAL | Status: DC

## 2012-04-13 MED ORDER — ERGOCALCIFEROL 1.25 MG (50000 UT) PO CAPS: 50000.0000 [IU] | ORAL_CAPSULE | ORAL | Status: DC

## 2012-04-13 MED ORDER — TOPIRAMATE 100 MG PO TABS: 100.0000 mg | ORAL_TABLET | Freq: Every day | ORAL | Status: DC

## 2012-04-13 MED ORDER — SUMATRIPTAN SUCCINATE 100 MG PO TABS: 100.0000 mg | ORAL_TABLET | ORAL | Status: DC | PRN

## 2012-04-13 NOTE — Patient Instructions (Addendum)
Today we updated your med list in our EPIC system...    Continue your current medications the same...  Today we did your FASTING blood work...    We will contact you w/ the results when avail...  Let's get on track w/ our diet & exercise program...    Concentrate on getting a good night's sleep & awakening felling refreshed & more energetic...  Call for any questions or if we can be of service in any way...  Let's continue our yearly check ups.Marland KitchenMarland Kitchen

## 2012-04-13 NOTE — Progress Notes (Signed)
Subjective:     Patient ID: Kristen [name], female   DOB: 1970/03/27, 42 y.o.   MRN: [id number]  HPI 42 y/o WF here for a yearly follow up and CPX... she has hypercholestrolemia treated w/ Simva20...  ~  April 09, 2010:  she's had a good yr but recent URI treated by Dentist w/ ZPak; but notes incr congestion, thick yellow sput, HA, etc... **we discussed rx w/ Augmentin, Depo/ Dosepak, Mucinex, Delsym, etc...  she has Hyperlipidemia & Chol OK on the Simva20 but needs better low fat diet;  she reports Vit D was low per GYN & placed on 50K weekly...  ~  April 11, 2011:  Yearly ROV & CPX> she's had a good yr but under incr stress w/ mother age71 s/p lumbar lam and grandmother age 60 w/ Alz & fall w/ fx hip; DrHaygood gave her Zoloft50 & this is helping- requests 100mg  tabs to take 1/2 Qd...    Lipids have looked good in recent yrs on Simva20 + diet; labs today showed TChol 167, TG 194, HDL 45, LDL 83; rec same med better low fat diet...    Weight is up 13# to 169# today due to the stress & not eating right or exercising much; we discussed these issues & she will do better...    DrHaygood, Gyn has been adjusting BCP/ hormones to help her situation (PCOS etc)...    Notes return of Migraines and the hormonal adjustments help + MIDRIN is avail again & this works for her...    Pt tells me that [facility] also checks her Vit D levels & had her on 50K weekly...  ~  April 13, 2012:  Yearly ROV & CPX> Kristen [name] reports "a few minor issues" over the past yr- mostly revolving around the orthopedic area w/ bursitis in hip, shoulder pain w/ "part dislocation" from sleeping wrong, knee pain, plantar fasciitis, fall w/ bruised rib cage; she has been attended by ZachSmithDO (rec by her mother since he does natural cures) who gave her a shot in her hip, adjusted her shoulder, etc;  She also notes decr energy, not resting well, ?FM...    She has gained 6# up to 175# and FLP is worse w/ incr TG to 413; needs better diet,  get wt down & start Fenofibrate160/d...    She continues f/u w/ GYN DrHaygood due to hx PCOS as noted below; remains on VitD 50K weekly w/ lab by GYN she says...    Hx migaine HAs on Topamax, Imitrex; prev on Midrin as needed but she tells me she's stopped all pain meds; she used to see DrFreeman but is doing well now on stable regimen...     She continues to do well on Zoloft 50mg /d... We reviewed prob list, meds, xrays and labs> see below for updates >> she had the 2013 Flu vaccine 11/13; last TDAP 4/11; refills written... 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 ~  FLP 9/08 showed TChol 146, TG 143, HDL 48, LDL 70... Chol is good on Simva20. ~  FLP 12/09 showed TChol 159, TG 165, HDL 34, LDL 92... reminder- low fat diet... ~  FLP 1/11 showed TChol  182, TG 294, HDL 48, LDL 75... OK on Simva20, may need Fibrate. ~  FLP 1/12 showed TChol 141, TG 216, HDL 46,LDL 70... contin same w/ low fat diet. ~  FLP 1/13 on Simva20 showed TChol 167, TG 194, HDL 45, LDL 83... rec same med, better low fat diet... ~  FLP 1/14 on Simva20 showed TChol 151, TG 413, HDL 33, LDL 70... rec to add FENOFIBRATE160/d.  Hx of MONONUCLEOSIS (ICD-075) - occured at age 62... no known residual.  POLYCYSTIC OVARIAN DISEASE (ICD-256.4) - followed by DrVHaygood who has her on BCPs/ hormones adjusted... ~  1/11:  [facility] & improved... ~  1/13:  Recurrent Migraines & DrHaygood has adjusted meds & added MIDRIN w/ improvement... ~  1/14:  She continues to follow up w/ DrHaygood for GYN...  ORTHO issues evaluated & treated by Dr. Terrilee Files, DO... Hx of PLANTAR FASCIITIS (ICD-728.71) - improved w/ soaks and anti-inflamm  meds...  Hx of FRACTURE, COCCYX (ICD-805.6)  VITAMIN D DEFICIENCY (ICD-268.9) - she tells me that her VitD level was low when checked by GYN & she's been placed on 50,000 u weekly...  MIGRAINES >> MIDRIN really helps & is back on this med... ~  She was evaluated & treated by DrFreeman's HA clinic on TOPAMAX 100mg /d & IMITREX 100mg  prn...  ANXIETY/ DEPRESSION >> started on Zoloft 50mg /d by [facility] in 2012; she reports that this is helping & pt requests Zoloft 100mg - take 1/2 qd...   Past Surgical History  Procedure Date  . Tonsillectomy and adenoidectomy     at age 75    Outpatient Encounter Prescriptions as of 04/13/2012  Medication Sig Dispense Refill  . Cetirizine HCl (ZYRTEC PO) Take by mouth.      . ergocalciferol (VITAMIN D2) 50000 UNITS capsule Take 1 capsule (50,000 Units total) by mouth once a week.  12 capsule  0  . fish oil-omega-3 fatty acids 1000 MG capsule Take 2 g by mouth daily.      Marland Kitchen isometheptene-acetaminophen-dichloralphenazone (MIDRIN) 65-325-100 MG capsule Take 1 capsule by mouth 4 (four) times daily as needed.      . norethindrone-ethinyl estradiol (NECON,BREVICON,MODICON) 0.5-35 MG-MCG tablet Take 1 tablet by mouth daily.      . sertraline (ZOLOFT) 100 MG tablet TAKE AS DIRECTED  90 tablet  3  . simvastatin (ZOCOR) 40 MG tablet TAKE AS DIRECTED  90 tablet  3  . SUMAtriptan (IMITREX) 100 MG tablet Take 100 mg by mouth as needed.      . topiramate (TOPAMAX) 100 MG tablet Take 1 tablet (100 mg total) by mouth daily.  30 tablet  5  . vitamin E 100 UNIT capsule Take 100 Units by mouth daily.      . [DISCONTINUED] aspirin 81 MG tablet Take 81 mg by mouth daily.      . [DISCONTINUED] meloxicam (MOBIC) 15 MG tablet Take 1 tablet (15 mg total) by mouth daily.  30 tablet  2  . [DISCONTINUED] traMADol (ULTRAM) 50 MG tablet Take 1 tablet (50 mg total) by mouth 3 (three) times daily as needed for pain.  90 tablet  0    No Known Allergies   Current Medications,  Allergies, Past Medical History, Past Surgical History, Family History, and Social History were reviewed in [employer] record.    Review of Systems        The patient complains of headaches.  The patient denies fever, chills, sweats, anorexia,  fatigue, weakness, malaise, weight loss, sleep disorder, blurring, diplopia, eye irritation, eye discharge, vision loss, eye pain, photophobia, earache, ear discharge, tinnitus, decreased hearing, nosebleeds, chest pain, palpitations, syncope, dyspnea on exertion, orthopnea, PND, peripheral edema, dyspnea at rest, excessive sputum, hemoptysis, wheezing, pleurisy, nausea, vomiting, diarrhea, constipation, change in bowel habits, abdominal pain, melena, hematochezia, jaundice, gas/bloating, indigestion/heartburn, dysphagia, odynophagia, dysuria, hematuria, urinary frequency, urinary hesitancy, nocturia, incontinence, back pain, joint pain, joint swelling, muscle cramps, muscle weakness, stiffness, arthritis, sciatica, restless legs, leg pain at night, leg pain with exertion, rash, itching, dryness, suspicious lesions, paralysis, paresthesias, seizures, tremors, vertigo, transient blindness, frequent falls, difficulty walking, depression, anxiety, memory loss, confusion, cold intolerance, heat intolerance, polydipsia, polyphagia, polyuria, unusual weight change, abnormal bruising, bleeding, enlarged lymph nodes, urticaria, allergic rash, hay fever, and recurrent infections.     Objective:   Physical Exam    WD, WN, 42 y/o WF in NAD... GENERAL:  Alert & oriented; pleasant & cooperative... HEENT:  [place]/AT, EOM-wnl, PERRLA, Fundi-benign, EACs-clear, TMs-wnl, NOSE-clear, THROAT-clear & wnl. NECK:  Supple w/ full ROM; no JVD; normal carotid impulses w/o bruits; no thyromegaly or nodules palpated; no lymphadenopathy. CHEST:  Clear to P & A; without wheezes/ rales/ or rhonchi. HEART:  Regular Rhythm; without murmurs/ rubs/ or gallops. ABDOMEN:   Soft & nontender; normal bowel sounds; no organomegaly or masses detected. EXT: without deformities or arthritic changes; no varicose veins/ venous insuffic/ or edema. NEURO:  CN's intact; motor testing normal; sensory testing normal; gait normal & balance OK. DERM:  No lesions noted; no rash etc...  RADIOLOGY DATA:  Reviewed in the EPIC EMR & discussed w/ the patient...    >>CXR 1/13 showed normal heart size, clear lungs, WNL...    >>EKG showed NSR, rate88, WNL, NAD...  LABORATORY DATA:  Reviewed in the EPIC EMR & discussed w/ the patient...   Assessment:      CPX>>  Hyperlipidemia>  Chol looks good on Simva20; TG further elev & rec to start FENOFIBRATE 160...  Polycystic Ovarian dis>  [facility]...  Vit D Defic>  Vit D levels checked by [facility] per pt...  HEADACHES>  Improved w/ hormone adjustment & w/ MIDRIN for prn use... She saw DrFreeman who started her on Topamax & Imitrex...  Anxiety/ Depression>  Improved on Zoloft50, continue same...     Plan:     Patient's Medications  New Prescriptions   No medications on file  Previous Medications   CETIRIZINE HCL (ZYRTEC PO)    Take by mouth.   FISH OIL-OMEGA-3 FATTY ACIDS 1000 MG CAPSULE    Take 2 g by mouth daily.   ISOMETHEPTENE-ACETAMINOPHEN-DICHLORALPHENAZONE (MIDRIN) 65-325-100 MG CAPSULE    Take 1 capsule by mouth 4 (four) times daily as needed.   NORETHINDRONE-ETHINYL ESTRADIOL (NECON,BREVICON,MODICON) 0.5-35 MG-MCG TABLET    Take 1 tablet by mouth daily.   VITAMIN E 100 UNIT CAPSULE    Take 100 Units by mouth daily.  Modified Medications   Modified Medication Previous Medication   ERGOCALCIFEROL (VITAMIN D2) 50000 UNITS CAPSULE ergocalciferol (VITAMIN D2) 50000 UNITS capsule      Take 1 capsule (50,000 Units total) by mouth once a week.    Take 1 capsule (50,000 Units total) by mouth once a week.   SERTRALINE (ZOLOFT) 100 MG TABLET sertraline (ZOLOFT) 100 MG tablet      TAKE AS DIRECTED     TAKE AS DIRECTED   SIMVASTATIN (ZOCOR) 40 MG TABLET simvastatin (ZOCOR) 40 MG tablet  TAKE AS DIRECTED    TAKE AS DIRECTED   SUMATRIPTAN (IMITREX) 100 MG TABLET SUMAtriptan (IMITREX) 100 MG tablet      Take 1 tablet (100 mg total) by mouth as needed for migraine.    Take 100 mg by mouth as needed.   TOPIRAMATE (TOPAMAX) 100 MG TABLET topiramate (TOPAMAX) 100 MG tablet      Take 1 tablet (100 mg total) by mouth daily.    Take 1 tablet (100 mg total) by mouth daily.  Discontinued Medications   ASPIRIN 81 MG TABLET    Take 81 mg by mouth daily.   MELOXICAM (MOBIC) 15 MG TABLET    Take 1 tablet (15 mg total) by mouth daily.   TRAMADOL (ULTRAM) 50 MG TABLET    Take 1 tablet (50 mg total) by mouth 3 (three) times daily as needed for pain.

## 2012-04-19 ENCOUNTER — Other Ambulatory Visit: Payer: Self-pay | Admitting: Pulmonary Disease

## 2012-04-19 MED ORDER — FENOFIBRATE 160 MG PO TABS: 160.0000 mg | ORAL_TABLET | Freq: Every day | ORAL | Status: DC

## 2012-05-03 ENCOUNTER — Telehealth: Payer: Self-pay | Admitting: Obstetrics and Gynecology

## 2012-05-03 ENCOUNTER — Other Ambulatory Visit: Payer: Self-pay | Admitting: Obstetrics and Gynecology

## 2012-05-03 DIAGNOSIS — Z1231 Encounter for screening mammogram for malignant neoplasm of breast: Secondary | ICD-10-CM

## 2012-06-08 ENCOUNTER — Ambulatory Visit (HOSPITAL_COMMUNITY): Payer: BC Managed Care – PPO

## 2012-07-02 ENCOUNTER — Other Ambulatory Visit: Payer: BC Managed Care – PPO

## 2012-07-06 ENCOUNTER — Ambulatory Visit (HOSPITAL_COMMUNITY): Payer: BC Managed Care – PPO

## 2012-07-20 ENCOUNTER — Ambulatory Visit
Admission: RE | Admit: 2012-07-20 | Discharge: 2012-07-20 | Disposition: A | Discharge status: Home / Self Care | Payer: BC Managed Care – PPO | Source: Ambulatory Visit | Attending: Obstetrics and Gynecology | Admitting: Obstetrics and Gynecology

## 2012-07-20 DIAGNOSIS — Z1231 Encounter for screening mammogram for malignant neoplasm of breast: Secondary | ICD-10-CM

## 2012-12-06 ENCOUNTER — Encounter: Payer: Self-pay | Admitting: Adult Health

## 2012-12-06 ENCOUNTER — Ambulatory Visit (INDEPENDENT_AMBULATORY_CARE_PROVIDER_SITE_OTHER): Payer: BC Managed Care – PPO | Admitting: Adult Health

## 2012-12-06 ENCOUNTER — Emergency Department (HOSPITAL_COMMUNITY)
Admission: EM | Admit: 2012-12-06 | Discharge: 2012-12-06 | Disposition: A | Discharge status: Home / Self Care | Payer: BC Managed Care – PPO | Attending: Emergency Medicine | Admitting: Emergency Medicine

## 2012-12-06 ENCOUNTER — Encounter (HOSPITAL_COMMUNITY): Payer: Self-pay

## 2012-12-06 ENCOUNTER — Emergency Department (HOSPITAL_COMMUNITY): Payer: BC Managed Care – PPO

## 2012-12-06 VITALS — BP 140/100 | HR 96 | Temp 98.3°F | Ht 60.0 in | Wt 176.8 lb

## 2012-12-06 DIAGNOSIS — R1013 Epigastric pain: Secondary | ICD-10-CM | POA: Insufficient documentation

## 2012-12-06 DIAGNOSIS — K567 Ileus, unspecified: Secondary | ICD-10-CM

## 2012-12-06 DIAGNOSIS — K560 Paralytic ileus: Secondary | ICD-10-CM | POA: Insufficient documentation

## 2012-12-06 DIAGNOSIS — G8929 Other chronic pain: Secondary | ICD-10-CM

## 2012-12-06 DIAGNOSIS — R1011 Right upper quadrant pain: Secondary | ICD-10-CM

## 2012-12-06 DIAGNOSIS — R109 Unspecified abdominal pain: Secondary | ICD-10-CM

## 2012-12-06 DIAGNOSIS — R142 Eructation: Secondary | ICD-10-CM | POA: Insufficient documentation

## 2012-12-06 DIAGNOSIS — R141 Gas pain: Secondary | ICD-10-CM | POA: Insufficient documentation

## 2012-12-06 DIAGNOSIS — Z79899 Other long term (current) drug therapy: Secondary | ICD-10-CM | POA: Insufficient documentation

## 2012-12-06 DIAGNOSIS — R52 Pain, unspecified: Secondary | ICD-10-CM

## 2012-12-06 DIAGNOSIS — E559 Vitamin D deficiency, unspecified: Secondary | ICD-10-CM | POA: Insufficient documentation

## 2012-12-06 DIAGNOSIS — Z8781 Personal history of (healed) traumatic fracture: Secondary | ICD-10-CM | POA: Insufficient documentation

## 2012-12-06 DIAGNOSIS — Z8739 Personal history of other diseases of the musculoskeletal system and connective tissue: Secondary | ICD-10-CM | POA: Insufficient documentation

## 2012-12-06 DIAGNOSIS — Z3202 Encounter for pregnancy test, result negative: Secondary | ICD-10-CM | POA: Insufficient documentation

## 2012-12-06 DIAGNOSIS — E782 Mixed hyperlipidemia: Secondary | ICD-10-CM | POA: Insufficient documentation

## 2012-12-06 DIAGNOSIS — R112 Nausea with vomiting, unspecified: Secondary | ICD-10-CM | POA: Insufficient documentation

## 2012-12-06 DIAGNOSIS — Z8619 Personal history of other infectious and parasitic diseases: Secondary | ICD-10-CM | POA: Insufficient documentation

## 2012-12-06 LAB — COMPREHENSIVE METABOLIC PANEL
ALT: 21 U/L (ref 0–35)
Albumin: 3.7 g/dL (ref 3.5–5.2)
BUN: 12 mg/dL (ref 6–23)
Calcium: 9.8 mg/dL (ref 8.4–10.5)
Chloride: 101 mEq/L (ref 96–112)
GFR calc Af Amer: >90 mL/min (ref >90)
GFR calc non Af Amer: >90 mL/min (ref >90)
Glucose, Bld: 87 mg/dL (ref 70–99)
Potassium: 3.9 mEq/L (ref 3.5–5.1)
Sodium: 138 mEq/L (ref 135–145)
Total Protein: 7.5 g/dL (ref 6.0–8.3)

## 2012-12-06 LAB — CBC WITH DIFFERENTIAL/PLATELET
Basophils Absolute: 0 10*3/uL (ref 0.0–0.1)
Basophils Relative: 0 % (ref 0–1)
Eosinophils Absolute: 0.2 10*3/uL (ref 0.0–0.7)
Eosinophils Relative: 1 % (ref 0–5)
Hemoglobin: 14.9 g/dL (ref 12.0–15.0)
Lymphocytes Relative: 29 % (ref 12–46)
Lymphs Abs: 4 10*3/uL (ref 0.7–4.0)
MCH: 31.2 pg (ref 26.0–34.0)
MCHC: 34.6 g/dL (ref 30.0–36.0)
MCV: 90.4 fL (ref 78.0–100.0)
Neutro Abs: 8.5 10*3/uL — ABNORMAL HIGH (ref 1.7–7.7)
Neutrophils Relative %: 63 % (ref 43–77)
Platelets: 278 10*3/uL (ref 150–400)
RDW: 12.6 % (ref 11.5–15.5)

## 2012-12-06 LAB — URINALYSIS, ROUTINE W REFLEX MICROSCOPIC
Nitrite: NEGATIVE
Specific Gravity, Urine: 1.027 (ref 1.005–1.030)
Urobilinogen, UA: 0.2 mg/dL (ref 0.0–1.0)
pH: 6.5 (ref 5.0–8.0)

## 2012-12-06 LAB — POCT PREGNANCY, URINE: Preg Test, Ur: NEGATIVE

## 2012-12-06 LAB — LIPASE, BLOOD: Lipase: 37 U/L (ref 11–59)

## 2012-12-06 LAB — URINE MICROSCOPIC-ADD ON

## 2012-12-06 MED ORDER — CIPROFLOXACIN HCL 500 MG PO TABS: 500.0000 mg | ORAL_TABLET | Freq: Two times a day (BID) | ORAL | Status: DC

## 2012-12-06 MED ORDER — ONDANSETRON HCL 4 MG/2ML IJ SOLN
4.0000 mg | Freq: Once | INTRAMUSCULAR | Status: AC
  Administered 2012-12-06: 4 mg via INTRAVENOUS
  Filled 2012-12-06: qty 2

## 2012-12-06 MED ORDER — HYDROCODONE-ACETAMINOPHEN 5-325 MG PO TABS: 2.0000 | ORAL_TABLET | ORAL | Status: DC | PRN

## 2012-12-06 MED ORDER — MORPHINE SULFATE 4 MG/ML IJ SOLN
4.0000 mg | Freq: Once | INTRAMUSCULAR | Status: AC
  Administered 2012-12-06: 4 mg via INTRAVENOUS
  Filled 2012-12-06: qty 1

## 2012-12-06 NOTE — ED Notes (Signed)
Patient is alert and oriented x3.  She was given DC instructions and follow up visit instructions.  Patient gave verbal understanding. She was DC ambulatory under her own power to home.  V/S stable.  He was not showing any signs of distress on DC 

## 2012-12-06 NOTE — ED Notes (Signed)
Bed: WA06 Expected date:  Expected time:  Means of arrival:  Comments: Christell Constant, triage 1

## 2012-12-06 NOTE — ED Notes (Signed)
Pt c/o LUQ abdominal pain and emesis x 3 weeks.  Pt sts pain increase after eating.  Pain score 7/10.  Pt sts her PCP directed her to come to ED.

## 2012-12-06 NOTE — Progress Notes (Signed)
  Subjective:    Patient ID: Kristen [name], female    DOB: 29-Jun-1970, 42 y.o.   MRN: [id number]  HPI 42 year old female with known history of hyperlipidemia.  12/06/2012 acute office visit Complains of ? Gallbladder attack  w/ upper GI discomfort, belching, nausea x1 week, worse today.  Reports a couple more episodes within the last 2-3 weeks. Patient complains the symptoms have worsened severely. Today and she is having severe right upper quadrant and epigastric, abdominal pain, she is associated, nausea, sweating, and weakness. She is taking several times without any relief. Patient denies any urinary symptoms. Back pain, fever, leg swelling, recent travel or antibiotic use. Bloody stools, or diarrhea. Symptoms worsen after she eats, and today. She ate [place] for lunch, and symptoms are much worse after eating. Patient is extremely uncomfortable in the office and has significant nausea. Patient feels that she could, vomiting, the moment.     Review of Systems Constitutional:   No  weight loss, night sweats,  Fevers, chills, fatigue, or  lassitude.  HEENT:   No headaches,  Difficulty swallowing,  Tooth/dental problems, or  Sore throat,                No sneezing, itching, ear ache, nasal congestion, post nasal drip,   CV:  No chest pain,  Orthopnea, PND, swelling in lower extremities, anasarca, dizziness, palpitations, syncope.   GI  + heartburn, indigestion, abdominal pain, nausea, vomiting, No  diarrhea, change in bowel habits, loss of appetite, bloody stools.   Resp: No shortness of breath with exertion or at rest.  No excess mucus, no productive cough,  No non-productive cough,  No coughing up of blood.  No change in color of mucus.  No wheezing.  No chest wall deformity  Skin: no rash or lesions.  GU: no dysuria, change in color of urine, no urgency or frequency.  No flank pain, no hematuria   MS:  No joint pain or swelling.  No decreased range of motion.  No back  pain.  Psych:  No change in mood or affect. No depression or anxiety.  No memory loss.          Objective:   Physical Exam GEN: A/Ox3; pleasant , NAD, well nourished   HEENT:  [place]/AT,  EACs-clear, TMs-wnl, NOSE-clear, THROAT-clear, no lesions, no postnasal drip or exudate noted.   NECK:  Supple w/ fair ROM; no JVD; normal carotid impulses w/o bruits; no thyromegaly or nodules palpated; no lymphadenopathy.  RESP  Clear  P & A; w/o, wheezes/ rales/ or rhonchi.no accessory muscle use, no dullness to percussion  CARD:  RRR, no m/r/g  , no peripheral edema, pulses intact, no cyanosis or clubbing.  GI:   Soft , +RUQ tenderness ,  nml bowel sounds; no organomegaly or masses detected. Neg CVA tenderness , mild guarding in RUQ   Musco: Warm bil, no deformities or joint swelling noted.   Neuro: alert, no focal deficits noted.    Skin: Warm, no lesions or rashes         Assessment & Plan:

## 2012-12-06 NOTE — ED Provider Notes (Signed)
CSN: [id number]     Arrival date & time 12/06/12  1712 History   First MD Initiated Contact with Patient 12/06/12 1827     Chief Complaint  Patient presents with  . Abdominal Pain  . Emesis   (Consider location/radiation/quality/duration/timing/severity/associated sxs/prior Treatment) HPI Comments: Patient is a 42 year old female presents with a three-week history of intermittent upper abdominal pain. This seems to occur with eating. She denies any fevers or chills. She denies any prior surgeries. Her bowel movements have been normal and there's not been any constipation or diarrhea or bloody stool  Patient is a 42 y.o. female presenting with abdominal pain. The history is provided by the patient.  Abdominal Pain Pain location:  Epigastric Pain quality: cramping   Pain radiates to:  Does not radiate Pain severity:  Moderate Onset quality:  Sudden Duration:  3 weeks Timing:  Constant Progression:  Worsening Chronicity:  New Relieved by:  Nothing Worsened by:  Nothing tried Ineffective treatments:  None tried Associated symptoms: nausea   Associated symptoms: no anorexia, no fatigue, no fever and no shortness of breath     Past Medical History  Diagnosis Date  . Mixed hyperlipidemia   . History of mononucleosis   . Polycystic ovarian disease   . Plantar fasciitis   . Fracture of coccyx   . Vitamin D deficiency    Past Surgical History  Procedure Laterality Date  . Tonsillectomy and adenoidectomy      at age 71   Family History  Problem Relation Age of Onset  . Hypertension Mother    History  Substance Use Topics  . Smoking status: Never Smoker   . Smokeless tobacco: Never Used  . Alcohol Use: Yes     Comment: rare use   OB History   Grav Para Term Preterm Abortions TAB SAB Ect Mult Living   2 2        2      Review of Systems  Constitutional: Negative for fever and fatigue.  Respiratory: Negative for shortness of breath.   Gastrointestinal: Positive for  nausea and abdominal pain. Negative for anorexia.  All other systems reviewed and are negative.    Allergies  Review of patient's allergies indicates no known allergies.  Home Medications   Current Outpatient Rx  Name  Route  Sig  Dispense  Refill  . belladonna alk-PHENObarbital (DONNATAL) 16.2 MG tablet   Oral   Take 1 tablet by mouth as needed.         . fish oil-omega-3 fatty acids 1000 MG capsule   Oral   Take 2 g by mouth daily.         . norethindrone-ethinyl estradiol (NECON,BREVICON,MODICON) 0.5-35 MG-MCG tablet   Oral   Take 1 tablet by mouth daily.         . sertraline (ZOLOFT) 25 MG tablet   Oral   Take 25 mg by mouth daily.         . simvastatin (ZOCOR) 40 MG tablet   Oral   Take 40 mg by mouth at bedtime.         . topiramate (TOPAMAX) 100 MG tablet   Oral   Take 100 mg by mouth daily.         . Vitamin D, Ergocalciferol, (DRISDOL) 50000 UNITS CAPS capsule   Oral   Take 50,000 Units by mouth every 14 (fourteen) days.         . vitamin E 100 UNIT capsule   Oral  Take 100 Units by mouth daily.         Marland Kitchen isometheptene-acetaminophen-dichloralphenazone (MIDRIN) 65-325-100 MG capsule   Oral   Take 1 capsule by mouth 4 (four) times daily as needed.         . SUMAtriptan (IMITREX) 100 MG tablet   Oral   Take 1 tablet (100 mg total) by mouth as needed for migraine.   10 tablet   1    BP 149/117  Pulse 89  Temp(Src) 98.6 F (37 C) (Oral)  Resp 24  Ht 5' (1.524 m)  Wt 176 lb (79.833 kg)  BMI 34.37 kg/m2  SpO2 97% Physical Exam  Nursing note and vitals reviewed. Constitutional: She is oriented to person, place, and time. She appears well-developed and well-nourished. No distress.  HENT:  Head: Normocephalic and atraumatic.  Neck: Normal range of motion. Neck supple.  Cardiovascular: Normal rate and regular rhythm.  Exam reveals no gallop and no friction rub.   No murmur heard. Pulmonary/Chest: Effort normal and breath sounds  normal. No respiratory distress. She has no wheezes.  Abdominal: Soft. Bowel sounds are normal. She exhibits distension. She exhibits no mass. There is no tenderness.  There is tenderness to palpation in the upper abdomen in the epigastric and left upper quadrants. There is no rebound no guarding.  Musculoskeletal: Normal range of motion.  Neurological: She is alert and oriented to person, place, and time.  Skin: Skin is warm and dry. She is not diaphoretic.    ED Course  Procedures (including critical care time) Labs Review Labs Reviewed  CBC WITH DIFFERENTIAL  COMPREHENSIVE METABOLIC PANEL  LIPASE, BLOOD  URINALYSIS, ROUTINE W REFLEX MICROSCOPIC  POCT PREGNANCY, URINE   Imaging Review No results found.  MDM  No diagnosis found. Patient presents here with complaints of epigastric and left upper quadrant pain that has been occurring intermittently for the past several weeks. Her pain worsened today. Workup reveals an elevated white count of 13.5 and CT scan of the abdomen and pelvis reveals evidence for an ileus in the area of her discomfort. She is having flatus and is having bowel movements which goes against this being a bowel obstruction. I have discussed the case with Dr. Purnell Shoemaker from general surgery who feels as though the patient should be admitted to medicine for further workup. I discussed this with the patient who stated she is now feeling better and does not want to stay. She has a primary physician who can follow her up and states she will make an appointment for the very near future. She will be discharged to home with Cipro and pain medication and strict instructions to return to the emergency department if her symptoms worsen, she develops high fever, or bloody stool or emesis.    Geoffery Lyons, MD 12/06/12 2157

## 2012-12-06 NOTE — Assessment & Plan Note (Signed)
Acute Abdominal pain suspect this is acute cholecystitis  She is quite uncomfortable in office , have advised her to go to ER at [facility] for evaluation and treatment.  Dr. Kriste Basque  Updated.  Pt aware and wants to drive herself.  [facility] ER triage RN notified

## 2013-02-06 ENCOUNTER — Ambulatory Visit: Payer: BC Managed Care – PPO | Admitting: Family Medicine

## 2013-02-12 ENCOUNTER — Ambulatory Visit: Payer: BC Managed Care – PPO | Admitting: Family Medicine

## 2013-03-29 ENCOUNTER — Encounter: Payer: Self-pay | Admitting: Family Medicine

## 2013-03-29 ENCOUNTER — Other Ambulatory Visit (INDEPENDENT_AMBULATORY_CARE_PROVIDER_SITE_OTHER): Payer: BC Managed Care – PPO

## 2013-03-29 ENCOUNTER — Ambulatory Visit (INDEPENDENT_AMBULATORY_CARE_PROVIDER_SITE_OTHER): Payer: BC Managed Care – PPO | Admitting: Family Medicine

## 2013-03-29 VITALS — BP 134/78 | HR 91 | Temp 97.8°F | Resp 16 | Wt 175.1 lb

## 2013-03-29 DIAGNOSIS — M25519 Pain in unspecified shoulder: Secondary | ICD-10-CM

## 2013-03-29 DIAGNOSIS — M719 Bursopathy, unspecified: Secondary | ICD-10-CM

## 2013-03-29 DIAGNOSIS — M75101 Unspecified rotator cuff tear or rupture of right shoulder, not specified as traumatic: Secondary | ICD-10-CM | POA: Insufficient documentation

## 2013-03-29 DIAGNOSIS — M67919 Unspecified disorder of synovium and tendon, unspecified shoulder: Secondary | ICD-10-CM

## 2013-03-29 MED ORDER — MELOXICAM 15 MG PO TABS: 15.0000 mg | ORAL_TABLET | Freq: Every day | ORAL | Status: DC

## 2013-03-29 NOTE — Patient Instructions (Signed)
Good to see you Tell my hi Meloxicam as needed.  Do exercises most days of the week Come back again in 3 weeks and we can do manipulation.

## 2013-03-29 NOTE — Assessment & Plan Note (Signed)
She had injection of right shoulder with ultrasound. Patient did have some relief of the pain. There is a concern that this could be radiation from her neck and we may need to think of further imaging. Patient did respond osteopathic manipulation previously and we may want to consider that at followup. Patient was given home exercises, anti-inflammatories, and icing protocol. Patient will try these interventions and come back again in 3 weeks for further evaluation.

## 2013-03-29 NOTE — Progress Notes (Signed)
CC: Shoulder pain  HPI: Patient is a very pleasant 43 year old female coming in with a complaint of shoulder pain. I've seen patient previously for osteopathic manipulation for rib cage dysfunction and lower back pain. Patient is coming in with more of right shoulder pain. Patient states that this has been more of an insidious onset and seems to be worsening over the course of time. Patient states that it hurts with overhead activity and reaching behind her back. Patient has had neck trouble before but states that this does feel somewhat different. Patient states that this pain can wake her up at night. Patient rates the pain a 7/10. Patient states the pain is more intermittent and has responded minimally to over-the-counter anti-inflammatories.   Past medical, surgical, family and social history reviewed. Medications reviewed all in the electronic medical record.   Review of Systems: No headache, visual changes, nausea, vomiting, diarrhea, constipation, dizziness, abdominal pain, skin rash, fevers, chills, night sweats, weight loss, swollen lymph nodes, body aches, joint swelling, muscle aches, chest pain, shortness of breath, mood changes.   Objective:    Blood pressure 134/78, pulse 91, temperature 97.8 F (36.6 C), temperature source Oral, resp. rate 16, weight 175 lb 1.3 oz (79.416 kg), SpO2 96.00%.   General: No apparent distress alert and oriented x3 mood and affect normal, dressed appropriately.  HEENT: Pupils equal, extraocular movements intact Respiratory: Patient's speak in full sentences and does not appear short of breath Cardiovascular: No lower extremity edema, non tender, no erythema Skin: Warm dry intact with no signs of infection or rash on extremities or on axial skeleton. Abdomen: Soft nontender Neuro: Cranial nerves II through XII are intact, neurovascularly intact in all extremities with 2+ DTRs and 2+ pulses. Lymph: No lymphadenopathy of posterior or anterior  cervical chain or axillae bilaterally.  Gait normal with good balance and coordination.  MSK: Non tender with full range of motion and good stability and symmetric strength and tone of  elbows, wrist, hip, knee and ankles bilaterally.  Shoulder: Inspection reveals no abnormalities, atrophy or asymmetry. Palpation is normal with no tenderness over AC joint or bicipital groove. ROM is full in all planes. Rotator cuff strength normal throughout.  signs of impingement with positive Neer and Hawkin's tests, empty can sign. Speeds and Yergason's tests normal. No labral pathology noted with negative Obrien's, negative clunk and good stability. Normal scapular function observed. No painful arc and no drop arm sign. No apprehension sign   MSK US performed of: Right shoulder This study was ordered, performed, and interpreted by Charlann Boxer D.O.  Shoulder:   Supraspinatus:  Appears normal on long and transverse views, no bursal bulge seen with shoulder abduction on impingement view. Mild bursa enlarged. Infraspinatus:  Appears normal on long and transverse views but does have a very large bursitis noted. Subscapularis:  Appears normal on long and transverse views. Teres Minor:  Appears normal on long and transverse views. AC joint:  Capsule undistended, no geyser sign. Glenohumeral Joint:  Appears normal without effusion. Glenoid Labrum:  Intact without visualized tears. Biceps Tendon:  Appears normal on long and transverse views, no fraying of tendon, tendon located in intertubercular groove, no subluxation with shoulder internal or external rotation. No increased power doppler signal.  Impression: Subacromial bursitis   Procedure: Real-time Ultrasound Guided Injection of right glenohumeral joint Device: GE Logiq E  Ultrasound guided injection is preferred based studies that show increased duration, increased effect, greater accuracy, decreased procedural pain, increased response rate with  ultrasound guided versus blind injection.  Verbal informed consent obtained.  Time-out conducted.  Noted no overlying erythema, induration, or other signs of local infection.  Skin prepped in a sterile fashion.  Local anesthesia: Topical Ethyl chloride.  With sterile technique and under real time ultrasound guidance:  Joint visualized.  23g 1  inch needle inserted posterior approach. Pictures taken for needle placement. Patient did have injection of 2 cc of 1% lidocaine, 2 cc of 0.5% Marcaine, and 1.0 cc of Kenalog 40 mg/dL. Completed without difficulty  Pain immediately resolved suggesting accurate placement of the medication.  Advised to call if fevers/chills, erythema, induration, drainage, or persistent bleeding.  Images permanently stored and available for review in the ultrasound unit.  Impression: Technically successful ultrasound guided injection.   Impression and Recommendations:     This case required medical decision making of moderate complexity.

## 2013-04-12 ENCOUNTER — Ambulatory Visit: Payer: BC Managed Care – PPO | Admitting: Pulmonary Disease

## 2013-04-15 ENCOUNTER — Encounter: Payer: Self-pay | Admitting: Pulmonary Disease

## 2013-04-15 ENCOUNTER — Ambulatory Visit (INDEPENDENT_AMBULATORY_CARE_PROVIDER_SITE_OTHER): Payer: BC Managed Care – PPO | Admitting: Pulmonary Disease

## 2013-04-15 ENCOUNTER — Ambulatory Visit (INDEPENDENT_AMBULATORY_CARE_PROVIDER_SITE_OTHER)
Admission: RE | Admit: 2013-04-15 | Discharge: 2013-04-15 | Disposition: A | Discharge status: Home / Self Care | Payer: BC Managed Care – PPO | Source: Ambulatory Visit | Attending: Pulmonary Disease | Admitting: Pulmonary Disease

## 2013-04-15 ENCOUNTER — Other Ambulatory Visit (INDEPENDENT_AMBULATORY_CARE_PROVIDER_SITE_OTHER): Payer: BC Managed Care – PPO

## 2013-04-15 VITALS — BP 132/88 | HR 80 | Temp 97.0°F | Ht 60.0 in | Wt 174.2 lb

## 2013-04-15 DIAGNOSIS — E282 Polycystic ovarian syndrome: Secondary | ICD-10-CM

## 2013-04-15 DIAGNOSIS — M67919 Unspecified disorder of synovium and tendon, unspecified shoulder: Secondary | ICD-10-CM

## 2013-04-15 DIAGNOSIS — K760 Fatty (change of) liver, not elsewhere classified: Secondary | ICD-10-CM

## 2013-04-15 DIAGNOSIS — G43909 Migraine, unspecified, not intractable, without status migrainosus: Secondary | ICD-10-CM

## 2013-04-15 DIAGNOSIS — Z0000 Encounter for general adult medical examination without abnormal findings: Secondary | ICD-10-CM

## 2013-04-15 DIAGNOSIS — M719 Bursopathy, unspecified: Secondary | ICD-10-CM

## 2013-04-15 DIAGNOSIS — K7689 Other specified diseases of liver: Secondary | ICD-10-CM

## 2013-04-15 DIAGNOSIS — M75101 Unspecified rotator cuff tear or rupture of right shoulder, not specified as traumatic: Secondary | ICD-10-CM

## 2013-04-15 DIAGNOSIS — E782 Mixed hyperlipidemia: Secondary | ICD-10-CM

## 2013-04-15 LAB — CBC WITH DIFFERENTIAL/PLATELET
Basophils Absolute: 0 10*3/uL (ref 0.0–0.1)
Basophils Relative: 0.4 % (ref 0.0–3.0)
EOS PCT: 1.1 % (ref 0.0–5.0)
Eosinophils Absolute: 0.1 10*3/uL (ref 0.0–0.7)
HEMATOCRIT: 41.3 % (ref 36.0–46.0)
HEMOGLOBIN: 13.7 g/dL (ref 12.0–15.0)
LYMPHS ABS: 3.4 10*3/uL (ref 0.7–4.0)
Lymphocytes Relative: 28.8 % (ref 12.0–46.0)
MCHC: 33 g/dL (ref 30.0–36.0)
MCV: 92 fl (ref 78.0–100.0)
Monocytes Absolute: 0.6 10*3/uL (ref 0.1–1.0)
Monocytes Relative: 5.2 % (ref 3.0–12.0)
NEUTROS ABS: 7.6 10*3/uL (ref 1.4–7.7)
Neutrophils Relative %: 64.5 % (ref 43.0–77.0)
Platelets: 276 10*3/uL (ref 150.0–400.0)
RBC: 4.49 Mil/uL (ref 3.87–5.11)
RDW: 13.2 % (ref 11.5–14.6)
WBC: 11.8 10*3/uL — AB (ref 4.5–10.5)

## 2013-04-15 LAB — HEPATIC FUNCTION PANEL
ALT: 33 U/L (ref 0–35)
AST: 28 U/L (ref 0–37)
Albumin: 3.6 g/dL (ref 3.5–5.2)
Alkaline Phosphatase: 42 U/L (ref 39–117)
Bilirubin, Direct: 0 mg/dL (ref 0.0–0.3)
TOTAL PROTEIN: 7.2 g/dL (ref 6.0–8.3)
Total Bilirubin: 0.4 mg/dL (ref 0.3–1.2)

## 2013-04-15 LAB — LIPID PANEL
Cholesterol: 170 mg/dL (ref 0–200)
HDL: 47.5 mg/dL (ref >39.00)
LDL Cholesterol: 87 mg/dL (ref 0–99)
TRIGLYCERIDES: 178 mg/dL — AB (ref 0.0–149.0)
Total CHOL/HDL Ratio: 4
VLDL: 35.6 mg/dL (ref 0.0–40.0)

## 2013-04-15 LAB — BASIC METABOLIC PANEL
BUN: 14 mg/dL (ref 6–23)
CALCIUM: 8.9 mg/dL (ref 8.4–10.5)
CO2: 24 meq/L (ref 19–32)
CREATININE: 0.5 mg/dL (ref 0.4–1.2)
Chloride: 105 mEq/L (ref 96–112)
GFR: 135.51 mL/min (ref >60.00)
GLUCOSE: 82 mg/dL (ref 70–99)
Potassium: 3.8 mEq/L (ref 3.5–5.1)
SODIUM: 136 meq/L (ref 135–145)

## 2013-04-15 MED ORDER — SERTRALINE HCL 100 MG PO TABS: ORAL_TABLET | ORAL | Status: DC

## 2013-04-15 MED ORDER — SIMVASTATIN 40 MG PO TABS: ORAL_TABLET | ORAL | Status: DC

## 2013-04-15 MED ORDER — SUMATRIPTAN SUCCINATE 100 MG PO TABS: 100.0000 mg | ORAL_TABLET | ORAL | Status: DC | PRN

## 2013-04-15 NOTE — Patient Instructions (Signed)
Today we updated your med list in our EPIC system...    Continue your current medications the same...    We refilled the meds you requested...  Today we did your follow up CXR, EKG, & FASTING blood work..    We will contact you w/ the results when available...    Let's get on track w/ our diet & exercise program...    Work on weight reduction to help your triglyerides and fatty liver...  Call for any questions...  Let's plan a follow up visit in 57yr, sooner if needed for problems.Marland KitchenMarland Kitchen

## 2013-04-15 NOTE — Progress Notes (Signed)
Subjective:     Patient ID: Kristen [name], female   DOB: 01-May-1970, 43 y.o.   MRN: [id number]  HPI 43 y/o WF here for a yearly follow up and CPX... she has hypercholestrolemia treated w/ Simva20...  ~  April 09, 2010:  she's had a good yr but recent URI treated by Dentist w/ ZPak; but notes incr congestion, thick yellow sput, HA, etc... **we discussed rx w/ Augmentin, Depo/ Dosepak, Mucinex, Delsym, etc...  she has Hyperlipidemia & Chol OK on the Simva20 but needs better low fat diet;  she reports Vit D was low per GYN & placed on 50K weekly...  ~  April 11, 2011:  Yearly ROV & CPX> she's had a good yr but under incr stress w/ mother age63 s/p lumbar lam and grandmother age 42 w/ Alz & fall w/ fx hip; DrHaygood gave her Zoloft50 & this is helping- requests 175m tabs to take 1/2 Qd...    Lipids have looked good in recent yrs on Simva20 + diet; labs today showed TChol 167, TG 194, HDL 45, LDL 83; rec same med better low fat diet...    Weight is up 13# to 169# today due to the stress & not eating right or exercising much; we discussed these issues & she will do better...    DrHaygood, Gyn has been adjusting BCP/ hormones to help her situation (PCOS etc)...    Notes return of Migraines and the hormonal adjustments help + MIDRIN is avail again & this works for her...    [facility] & had her on 50K weekly...  ~  April 13, 2012:  Yearly ROV & CPX> Kristen Starlingreports "a few minor issues" over the past yr- mostly revolving around the orthopedic area w/ bursitis in hip, shoulder pain w/ "part dislocation" from sleeping wrong, knee pain, plantar fasciitis, fall w/ bruised rib cage; she has been attended by ZachSmithDO (rec by her mother since he does natural cures) who gave her a shot in her hip, adjusted her shoulder, etc;  She also notes decr energy, not resting well, ?FM...    She has gained 6# up to 175# and FLP is worse w/ incr TG to 413; needs better diet,  get wt down & start Fenofibrate160/d...    She continues f/u w/ GYN DrHaygood due to hx PCOS as noted below; remains on VitD 50K weekly w/ lab by GYN she says...    Hx migaine HAs on Topamax, Imitrex; prev on Midrin as needed but she tells me she's stopped all pain meds; she used to see DrFreeman but is doing well now on stable regimen...     She continues to do well on Zoloft 558md... We reviewed prob list, meds, xrays and labs> see below for updates >> she had the 2013 Flu vaccine 11/13; last TDAP 4/11; refills written... LABS 1/14:  FLP- TChol 151, TG 413, HDL 33, LDL 70 on Simva20;  Chems- wnl;  CBC- wnl;  TSH=1.30   ~  April 15, 2013:  Yearly ROV & CPX> Kristen Starlingeports a good yr- but she has had some bursitis in right shoulder, eval by DrZSmith w/ shot & exercises that have helped;  Sh had a bout of Abd pain 9/14- went to the ER to r/o GB, had CT Abd showing hep steatosis, 69m22malculous in right kidney, sl dilated sm bowel, uterine fibroids; treated w/ antibiotics and pain meds=> resolved; GYN is dealing w/ those issues... We  reviewed the following medical problems during today's office visit >>     Hyperlipid> on Simva40-1/2/d & diet; she refused Feno160 due to $$; FLP 2/15 showed TChol 170, TG 178, HDL 48, LDL 87... Needs better low fat diet & wt reduction...     Polycystic Ovarian Dis> followed by [facility] [place]- DrHaygood w/ heavy menses, on BCPs, we do not have notes from them...    ORTHO> followed for sports med by DrZSmith, bursitis in right shoulder w/ shot & exercises- improved...     VitD defic> on 50K weekly per Gyn but she stopped & using OTC supplement... VitD level done by Gyn she says...    Hx Migraines> on Mobic15 & Imitrex100 prn; off prev Topamax & Vicodin;     Hx anxiety/depression> on Zoloft25 & she is doing satis on this dose she says... We reviewed prob list, meds, xrays and labs> see below for updates >>  CXR 2/15 showed norm heart size, clear lungs, NAD.Marland KitchenMarland Kitchen EKG 2/15 showed NSR,  rate80, WNL.Marland KitchenMarland Kitchen LABS 2/15:  FLP- ok x TG=178;  Chems- wnl;  CBC- wnl;  NOTE- TSH & VitD were done by her Gyn she says & we don't have those records...            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  MIXED HYPERLIPIDEMIA (ICD-272.2) - on SIMVASTATIN 71m- taking 1/2 tab daily... ~  FS[place]9/08 showed TChol 146, TG 143, HDL 48, LDL 70... Chol is good on Simva20. ~  FL[place]12/09 showed TChol 159, TG 165, HDL 34, LDL 92... reminder- low fat diet... ~  [place] showed TChol 182, TG 294, HDL 48, LDL 75... OK on Simva20, may need Fibrate. ~  FLP 1/12 showed TChol 141, TG 216, HDL 46,LDL 70... contin same w/ low fat diet. ~  FLP 1/13 on Simva20 showed TChol 167, TG 194, HDL 45, LDL 83... rec same med, better low fat diet... ~  [place] on Simva20 showed TChol 151, TG 413, HDL 33, LDL 70... rec to add FENOFIBRATE160/d=> she stopped due to $$ ~  FLP 2/15 on Simva20 showed TChol 170, TG 178, HDL 48, LDL 87  Hx of MONONUCLEOSIS (ICD-075) - occured at age 43.. no known residual.  POLYCYSTIC OVARIAN DISEASE (ICD-256.4) - followed by DrVHaygood who has her on BCPs/ hormones adjusted... ~  1/11:  she [facility]/ [facility] & improved... ~  1/13:  Recurrent Migraines & DrHaygood has adjusted meds & added MIDRIN w/ improvement... ~  1/14 & 2/15:  She continues to follow up w/ DrHaygood for GYN...  ORTHO issues evaluated & treated by Dr. ZCharlann Boxer DO... 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  Hx of FRACTURE, COCCYX (ICD-805.6)  VITAMIN D DEFICIENCY (ICD-268.9) - she tells me that her VitD level was low when checked by GYN & she's been placed on 50,000 u weekly... ~  She stopped the 50K suppplement & taking OTC supplement now, labs checked by Gyn she says...  MIGRAINES >> MIDRIN really helps & is back on this med... ~  She was evaluated & treated by DrFreeman's [facility]  clinic on TOPAMAX 1070md & IMITREX 10028mrn...  ANXIETY/ DEPRESSION >> [facility]; she reports that this is helping & pt requests Zoloft 100mg64mke 1/2 qd...   Past Surgical History  Procedure Laterality Date  . Tonsillectomy and  adenoidectomy      at age 6    Outpatient Encounter Prescriptions as of 04/15/2013  Medication Sig  . fish oil-omega-3 fatty acids 1000 MG capsule Take 2 g by mouth daily.  . meloxicam (MOBIC) 15 MG tablet Take 1 tablet (15 mg total) by mouth daily.  . norethindrone-ethinyl estradiol (NECON,BREVICON,MODICON) 0.5-35 MG-MCG tablet Take 1 tablet by mouth daily.  . sertraline (ZOLOFT) 25 MG tablet Take 25 mg by mouth daily.  . simvastatin (ZOCOR) 40 MG tablet Take 40 mg by mouth at bedtime.  . SUMAtriptan (IMITREX) 100 MG tablet Take 1 tablet (100 mg total) by mouth as needed for migraine.  . vitamin E 100 UNIT capsule Take 100 Units by mouth daily.  . [DISCONTINUED] belladonna alk-PHENObarbital (DONNATAL) 16.2 MG tablet Take 1 tablet by mouth as needed.  . [DISCONTINUED] ciprofloxacin (CIPRO) 500 MG tablet Take 1 tablet (500 mg total) by mouth 2 (two) times daily. One po bid x 7 days  . [DISCONTINUED] HYDROcodone-acetaminophen (NORCO) 5-325 MG per tablet Take 2 tablets by mouth every 4 (four) hours as needed for pain.  . [DISCONTINUED] isometheptene-acetaminophen-dichloralphenazone (MIDRIN) 65-325-100 MG capsule Take 1 capsule by mouth 4 (four) times daily as needed.  . [DISCONTINUED] topiramate (TOPAMAX) 100 MG tablet Take 100 mg by mouth daily.  . [DISCONTINUED] Vitamin D, Ergocalciferol, (DRISDOL) 50000 UNITS CAPS capsule Take 50,000 Units by mouth every 14 (fourteen) days.    No Known Allergies   Current Medications, Allergies, Past Medical History, Past Surgical History, Family History, and Social History were reviewed in [employer] record.    Review of Systems        The patient complains of  headaches.  The patient denies fever, chills, sweats, anorexia, fatigue, weakness, malaise, weight loss, sleep disorder, blurring, diplopia, eye irritation, eye discharge, vision loss, eye pain, photophobia, earache, ear discharge, tinnitus, decreased hearing, nosebleeds, chest pain, palpitations, syncope, dyspnea on exertion, orthopnea, PND, peripheral edema, dyspnea at rest, excessive sputum, hemoptysis, wheezing, pleurisy, nausea, vomiting, diarrhea, constipation, change in bowel habits, abdominal pain, melena, hematochezia, jaundice, gas/bloating, indigestion/heartburn, dysphagia, odynophagia, dysuria, hematuria, urinary frequency, urinary hesitancy, nocturia, incontinence, back pain, joint pain, joint swelling, muscle cramps, muscle weakness, stiffness, arthritis, sciatica, restless legs, leg pain at night, leg pain with exertion, rash, itching, dryness, suspicious lesions, paralysis, paresthesias, seizures, tremors, vertigo, transient blindness, frequent falls, difficulty walking, depression, anxiety, memory loss, confusion, cold intolerance, heat intolerance, polydipsia, polyphagia, polyuria, unusual weight change, abnormal bruising, bleeding, enlarged lymph nodes, urticaria, allergic rash, hay fever, and recurrent infections.     Objective:   Physical Exam    WD, WN, 43 y/o WF in NAD... GENERAL:  Alert & oriented; pleasant & cooperative... HEENT:  Hobbs/AT, EOM-wnl, PERRLA, Fundi-benign, EACs-clear, TMs-wnl, NOSE-clear, THROAT-clear & wnl. NECK:  Supple w/ full ROM; no JVD; normal carotid impulses w/o bruits; no thyromegaly or nodules palpated; no lymphadenopathy. CHEST:  Clear to P & A; without wheezes/ rales/ or rhonchi. HEART:  Regular Rhythm; without murmurs/ rubs/ or gallops. ABDOMEN:  Soft & nontender; normal bowel sounds; no organomegaly or masses detected. EXT: without deformities or arthritic changes; no varicose veins/ venous insuffic/ or edema. NEURO:  CN's intact; motor testing normal;  sensory testing normal; gait normal & balance OK. DERM:  No lesions noted; no rash etc...  RADIOLOGY DATA:  Reviewed in the EPIC EMR & discussed w/ the patient...   LABORATORY DATA:  Reviewed in the EPIC EMR & discussed w/ the patient...   Assessment:  CPX>>  Hyperlipidemia>  Chol looks good on Simva20; TG further elev & rec to start FENOFIBRATE 160...  Polycystic Ovarian dis>  Managed [facility] on hormone therapy...  Vit D Defic>  Vit D levels [facility] per pt...  HEADACHES>  Improved w/ hormone adjustment & w/ MIDRIN for prn use... She saw DrFreeman who started her on Topamax & Imitrex...  Anxiety/ Depression>  Improved on Zoloft50, continue same...     Plan:

## 2013-04-19 ENCOUNTER — Ambulatory Visit (INDEPENDENT_AMBULATORY_CARE_PROVIDER_SITE_OTHER): Payer: BC Managed Care – PPO | Admitting: Family Medicine

## 2013-04-19 ENCOUNTER — Ambulatory Visit: Payer: BC Managed Care – PPO | Admitting: Pulmonary Disease

## 2013-04-19 ENCOUNTER — Encounter: Payer: Self-pay | Admitting: Family Medicine

## 2013-04-19 VITALS — BP 120/80 | HR 83 | Wt 174.4 lb

## 2013-04-19 DIAGNOSIS — M999 Biomechanical lesion, unspecified: Secondary | ICD-10-CM | POA: Insufficient documentation

## 2013-04-19 DIAGNOSIS — M549 Dorsalgia, unspecified: Secondary | ICD-10-CM

## 2013-04-19 NOTE — Patient Instructions (Signed)
Good to see you Start the back exercises  3 times a  Week and shoulder 3 times a week.  Meloxicam is fine as you need it.  Lets see you again in 3 weeks.

## 2013-04-19 NOTE — Assessment & Plan Note (Signed)
Patient is back pain is likely musculoskeletal in nature. I do not see any reason to do any further imaging at this time. Home exercise program was given focusing on range of motion on core strengthening. In addition this patient did respond well to osteopathic manipulation. Patient will come back again in 3 weeks for further evaluation.

## 2013-04-19 NOTE — Assessment & Plan Note (Signed)
Decision today to treat with OMT was based on Physical Exam  After verbal consent patient was treated with HVLA techniques in thoracic, lumbar and sacral and rib areas  Patient tolerated the procedure well with improvement in symptoms  Patient given exercises, stretches and lifestyle modifications  See medications in patient instructions if given  Patient will follow up 3-4 weeks  

## 2013-04-19 NOTE — Progress Notes (Signed)
Pre visit review using our clinic review tool, if applicable. No additional management support is needed unless otherwise documented below in the visit note. 

## 2013-04-19 NOTE — Progress Notes (Signed)
CC: Shoulder pain f/u  HPI: Patient is a very pleasant 43 year old female coming in with a complaint of shoulder pain follow up. Patient states that her pain is almost completely resolved of this shoulder. Patient states that she can do all activities of daily living is resting comfortably. Patient is very happy with the results after the injection. Patient is doing home exercises 3-4 times a week. Denies any new symptoms.  Patient does have a new problem. Patient is having more low back pain. Patient was helping move boxes and did feel some mild discomfort but unfortunately has not Don Perking. Patient denies any radiation down the legs but states it is more the right side and is a dull chronic aching sensation that is worse with certain movements. Patient has respond to osteopathic manipulation previously and would like to see if that is available to her today.   Past medical, surgical, family and social history reviewed. Medications reviewed all in the electronic medical record.   Review of Systems: No headache, visual changes, nausea, vomiting, diarrhea, constipation, dizziness, abdominal pain, skin rash, fevers, chills, night sweats, weight loss, swollen lymph nodes, body aches, joint swelling, muscle aches, chest pain, shortness of breath, mood changes.   Objective:    Blood pressure 120/80, pulse 83, weight 174 lb 6.4 oz (79.107 kg), last menstrual period 04/15/2013, SpO2 98.00%.   General: No apparent distress alert and oriented x3 mood and affect normal, dressed appropriately.  HEENT: Pupils equal, extraocular movements intact Respiratory: Patient's speak in full sentences and does not appear short of breath Cardiovascular: No lower extremity edema, non tender, no erythema Skin: Warm dry intact with no signs of infection or rash on extremities or on axial skeleton. Abdomen: Soft nontender Neuro: Cranial nerves II through XII are intact, neurovascularly intact in all extremities with  2+ DTRs and 2+ pulses. Lymph: No lymphadenopathy of posterior or anterior cervical chain or axillae bilaterally.  Gait normal with good balance and coordination.  MSK: Non tender with full range of motion and good stability and symmetric strength and tone of  elbows, wrist, hip, knee and ankles bilaterally.  Shoulder: Inspection reveals no abnormalities, atrophy or asymmetry. Palpation is normal with no tenderness over AC joint or bicipital groove. ROM is full in all planes. Rotator cuff strength normal throughout.  No signs of impingement with negative Neer and Hawkin's tests, empty can sign. Speeds and Yergason's tests normal. No labral pathology noted with negative Obrien's, negative clunk and good stability. Normal scapular function observed. No painful arc and no drop arm sign. No apprehension sign  Back Exam:  Inspection: Unremarkable  Motion: Flexion 45 deg, Extension 45 deg, Side Bending to 45 deg bilaterally,  Rotation to 45 deg bilaterally  SLR laying: Negative  XSLR laying: Negative  Palpable tenderness: Tenderness to palpation mostly over the right-sided lumbar region. FABER: negative. Sensory change: Gross sensation intact to all lumbar and sacral dermatomes.  Reflexes: 2+ at both patellar tendons, 2+ at achilles tendons, Babinski's downgoing.  Strength at foot  Plantar-flexion: 5/5 Dorsi-flexion: 5/5 Eversion: 5/5 Inversion: 5/5  Leg strength  Quad: 5/5 Hamstring: 5/5 Hip flexor: 5/5 Hip abductors: 5/5  Gait unremarkable.  OMT Physical Exam  Cervical  C 5 F RS right  Thoracic  T 1 E RS right  T5 E RS left  Lumbar  L2 F RS right  Sacrum  Left on left  Illium  Neutral.     Impression and Recommendations:     This case required medical  decision making of moderate complexity.

## 2013-04-19 NOTE — Assessment & Plan Note (Signed)
Decision today to treat with OMT was based on Physical Exam  After verbal consent patient was treated with HVLA techniques in thoracic, lumbar and sacral and rib areas  Patient tolerated the procedure well with improvement in symptoms  Patient given exercises, stretches and lifestyle modifications  See medications in patient instructions if given  Patient will follow up 3-4 weeks

## 2013-05-10 ENCOUNTER — Ambulatory Visit: Payer: BC Managed Care – PPO | Admitting: Family Medicine

## 2013-06-19 ENCOUNTER — Other Ambulatory Visit: Payer: Self-pay

## 2013-06-19 DIAGNOSIS — Z1231 Encounter for screening mammogram for malignant neoplasm of breast: Secondary | ICD-10-CM

## 2013-07-22 ENCOUNTER — Encounter (INDEPENDENT_AMBULATORY_CARE_PROVIDER_SITE_OTHER): Payer: Self-pay

## 2013-07-22 ENCOUNTER — Ambulatory Visit
Admission: RE | Admit: 2013-07-22 | Discharge: 2013-07-22 | Disposition: A | Discharge status: Home / Self Care | Payer: BC Managed Care – PPO | Source: Ambulatory Visit

## 2013-07-22 DIAGNOSIS — Z1231 Encounter for screening mammogram for malignant neoplasm of breast: Secondary | ICD-10-CM

## 2013-09-04 ENCOUNTER — Other Ambulatory Visit: Payer: Self-pay | Admitting: Obstetrics and Gynecology

## 2013-09-17 ENCOUNTER — Other Ambulatory Visit: Payer: Self-pay | Admitting: Obstetrics and Gynecology

## 2013-09-17 ENCOUNTER — Encounter (HOSPITAL_COMMUNITY): Payer: Self-pay | Admitting: Pharmacist

## 2013-09-17 DIAGNOSIS — E559 Vitamin D deficiency, unspecified: Secondary | ICD-10-CM

## 2013-09-18 ENCOUNTER — Other Ambulatory Visit: Payer: Self-pay | Admitting: Obstetrics and Gynecology

## 2013-09-18 ENCOUNTER — Other Ambulatory Visit (HOSPITAL_COMMUNITY): Payer: Self-pay | Admitting: Obstetrics and Gynecology

## 2013-09-18 DIAGNOSIS — E559 Vitamin D deficiency, unspecified: Secondary | ICD-10-CM

## 2013-09-18 NOTE — H&P (Signed)
Kristen [name] is a 42 y.o. female G:1;P 2-0-0-2  for hysterectomy because of menorrhagia, and fibroids.  The patient has a history of menorrhagia that over the years has been managed with oral contraceptives that allow for withdrawal bleed every 3 months..  Since January 2015 the patient has had continuous vaginal bleeding in spite of her daily oral contraceptive use.  The bleeding ranges from heavy to light,  requiring the change of an overnight pad every 1.5-5 hours.  She will experience cramping that she rates at 6/10 on a 10 point pain scale  but is relieved with Advil 400 mg.  Her hemoglobin in January 2015 was 14.4.  She denies any dyspareunia, changes in bowel or bladder function but admits to vaginal odor. A pelvic ultrasound in February 2015 showed: anteverted uterus: 7.82 x 6.94 x 7.92 cm with an endometrium measuring 25.28 mm containing endometrial masses: 2.5 c.m. and 2.8 c.m.; Six fibroids were seen measuring: right anterior-4.61 x3.54 x 3.57 cm; left intramural-1.66 x 1.03 x 1.27 cm; submucosal-2.09 x 1.41 x 1.88 cm; #2 posterior intramural-3.07 x 2.47 x 2.94 cm & 4.33 x 3.71 x 4.50 cm and anterior left intramural-2.21 x 1.83 x 1.93 cm;  left ovary-2.26 x 1.15 x 0.97 cm and right ovary-2.43 x 0.96 x 0.93 cm.  An endometrial biopsy done at pre-operative exam showed degenerating secretory endometrium with no evidence of malignancy or atypia. . A review of both medical and surgical management options were given to the patient however, given the protracted and disruptive nature of her symptoms, the patient has decided to proceed with definitive therapy in the form of hysterectomy.   Past Medical History  OB History: G:1;  P 2-0-0-2  GYN History: menarche: 43 YO;    LMP: January 2015    Contracepton: Oral Contraceptives;  Denies history of abnormal PAP smear or STDs;   Last PAP smear: January 2015  Medical History: Vitamin D Deficiency, Right Shoulder Bursitis; PCOS, Anxiety, Hyperlipidemia,  Plantar Fasciitis, Coccygeal Fracture and Migraine  Surgical History: 1990   Tonsillectomy Denies problems with anesthesia or history of blood transfusions  Family History: Hypertension and Diabetes Mellitus  Social History: Married and employed as a Copywriter, advertising; Rare alcohol use but denies tobacco use   Outpatient Encounter Prescriptions as of 09/18/2013  Medication Sig  . fish oil-omega-3 fatty acids 1000 MG capsule Take 1 g by mouth daily.   . Multiple Vitamin (MULTIVITAMIN WITH MINERALS) TABS tablet Take 1 tablet by mouth daily.  . norethindrone-ethinyl estradiol (NECON,BREVICON,MODICON) 0.5-35 MG-MCG tablet Take 1 tablet by mouth daily.  . sertraline (ZOLOFT) 100 MG tablet Take 1/2 tablet by mouth daily  . simvastatin (ZOCOR) 40 MG tablet Take 1/2 tablet by mouth daily  . vitamin C (ASCORBIC ACID) 500 MG tablet Take 500 mg by mouth daily.  . vitamin E 100 UNIT capsule Take 100 Units by mouth daily.  Z-pak as directed Metrogel Vaginal 1 applicatorful per vagina daily for 5 days   No Known Allergies  Denies sensitivity to peanuts, shellfish, soy, latex or adhesives.  ROS: Admits to seasonal allergies and right leg rhus rash;  Denies headache, vision changes, nasal congestion, dysphagia, tinnitus, dizziness, hoarseness, cough,  chest pain, shortness of breath, nausea, vomiting, diarrhea,constipation,  urinary frequency, urgency  dysuria, hematuria, vaginitis symptoms, swelling of joints,easy bruising,  myalgias, arthralgias,  unexplained weight loss and except as is mentioned in the history of present illness, patient's review of systems is otherwise negative.   Physical Exam  Bp: 136/76    P: 100   R: 16   Temperature: 98.8 degrees F orally   Weight: 175lbs.  Height: 5'    BMI: 34.2  Neck: supple without masses or thyromegaly Lungs: clear to auscultation Heart: regular rate and rhythm Abdomen: soft, non-tender and no organomegaly Pelvic:EGBUS- wnl; vagina-normal rugae;  uterus-irregular and upper limits of normal size (exam limited by habitus)  , cervix without lesions or motion tenderness; adnexae-no tenderness or masses Extremities:  no clubbing, cyanosis or edema   Assesment: Menorrhagia           Fibroids                      Dysmenorrhea                      Polycystic Ovarian Syndrome   Disposition:  A discussion was held with patient regarding the indication for her procedure(s) along with the risks, which include but are not limited to: reaction to anesthesia, damage to adjacent organs, infection,  excessive bleeding and possible need for an open abdominal incision.  The patient verbalized understanding of these risks and has consented to proceed with a Total Vaginal Hysterectomy with Bilateral Salpingectomy with a Possible Total Abdominal Hysterectomy at [place] on September 25, 2013 at 7:30 a.m.   CSN# [id number]   Gilda Abboud J. Florene Glen, PA-C  for Dr Lorriane Shire P. Hay[place].

## 2013-09-20 ENCOUNTER — Encounter (INDEPENDENT_AMBULATORY_CARE_PROVIDER_SITE_OTHER): Payer: Self-pay

## 2013-09-20 ENCOUNTER — Encounter (HOSPITAL_COMMUNITY): Payer: Self-pay

## 2013-09-20 ENCOUNTER — Encounter (HOSPITAL_COMMUNITY)
Admission: RE | Admit: 2013-09-20 | Discharge: 2013-09-20 | Disposition: A | Discharge status: Home / Self Care | Payer: BC Managed Care – PPO | Source: Ambulatory Visit | Attending: Obstetrics and Gynecology | Admitting: Obstetrics and Gynecology

## 2013-09-20 DIAGNOSIS — Z01812 Encounter for preprocedural laboratory examination: Secondary | ICD-10-CM | POA: Insufficient documentation

## 2013-09-20 HISTORY — DX: Other specified postprocedural states: Z98.890

## 2013-09-20 HISTORY — DX: Nausea with vomiting, unspecified: R11.2

## 2013-09-20 LAB — ABO/RH: ABO/RH(D): O POS

## 2013-09-20 LAB — TYPE AND SCREEN
ABO/RH(D): O POS
ANTIBODY SCREEN: NEGATIVE

## 2013-09-20 LAB — CBC
HEMATOCRIT: 34.6 % — AB (ref 36.0–46.0)
Hemoglobin: 10.9 g/dL — ABNORMAL LOW (ref 12.0–15.0)
MCH: 25.5 pg — ABNORMAL LOW (ref 26.0–34.0)
MCHC: 31.5 g/dL (ref 30.0–36.0)
MCV: 81 fL (ref 78.0–100.0)
PLATELETS: 286 10*3/uL (ref 150–400)
RBC: 4.27 MIL/uL (ref 3.87–5.11)
RDW: 14.9 % (ref 11.5–15.5)
WBC: 9.4 10*3/uL (ref 4.0–10.5)

## 2013-09-20 NOTE — Patient Instructions (Signed)
09/26/1518 Kristen [name]  09/20/2013   Your procedure is scheduled on:  09/25/13  Enter through the Main Entrance of [facility] [place] up the phone at the desk and dial [phone].   Call this number if you have problems the morning of surgery: [phone]   Remember:   Do not eat food:After Midnight.  Do not drink clear liquids: After Midnight.  Take these medicines the morning of surgery with A SIP OF WATER: NA   Do not wear jewelry, make-up or nail polish.  Do not wear lotions, powders, or perfumes. You may wear deodorant.  Do not shave 48 hours prior to surgery.  Do not bring valuables to the hospital.  [facility] is not   responsible for any belongings or valuables brought to the hospital.  Contacts, dentures or bridgework may not be worn into surgery.  Leave suitcase in the car. After surgery it may be brought to your room.  For patients admitted to the hospital, checkout time is 11:00 AM the day of              discharge.   Patients discharged the day of surgery will not be allowed to drive             home.  Name and phone number of your driver: NA  Special Instructions:      Please read over the following fact sheets that you were given:   Surgical Site Infection Prevention

## 2013-09-24 MED ORDER — DEXTROSE 5 % IV SOLN
2.0000 g | INTRAVENOUS | Status: AC
  Administered 2013-09-25: 2 g via INTRAVENOUS
  Filled 2013-09-24: qty 2

## 2013-09-25 ENCOUNTER — Encounter (HOSPITAL_COMMUNITY)
Admission: RE | Disposition: A | Discharge status: Home / Self Care | Payer: Self-pay | Source: Ambulatory Visit | Attending: Obstetrics and Gynecology

## 2013-09-25 ENCOUNTER — Ambulatory Visit (HOSPITAL_COMMUNITY): Payer: BC Managed Care – PPO | Admitting: Anesthesiology

## 2013-09-25 ENCOUNTER — Encounter (HOSPITAL_COMMUNITY): Payer: BC Managed Care – PPO | Admitting: Anesthesiology

## 2013-09-25 ENCOUNTER — Observation Stay (HOSPITAL_COMMUNITY)
Admission: RE | Admit: 2013-09-25 | Discharge: 2013-09-26 | Disposition: A | Discharge status: Home / Self Care | Payer: BC Managed Care – PPO | Source: Ambulatory Visit | Attending: Obstetrics and Gynecology | Admitting: Obstetrics and Gynecology

## 2013-09-25 ENCOUNTER — Encounter (HOSPITAL_COMMUNITY): Payer: Self-pay | Admitting: Obstetrics and Gynecology

## 2013-09-25 DIAGNOSIS — E669 Obesity, unspecified: Secondary | ICD-10-CM | POA: Insufficient documentation

## 2013-09-25 DIAGNOSIS — D500 Iron deficiency anemia secondary to blood loss (chronic): Secondary | ICD-10-CM | POA: Diagnosis present

## 2013-09-25 DIAGNOSIS — D219 Benign neoplasm of connective and other soft tissue, unspecified: Secondary | ICD-10-CM | POA: Diagnosis present

## 2013-09-25 DIAGNOSIS — N920 Excessive and frequent menstruation with regular cycle: Principal | ICD-10-CM | POA: Insufficient documentation

## 2013-09-25 DIAGNOSIS — E282 Polycystic ovarian syndrome: Secondary | ICD-10-CM | POA: Insufficient documentation

## 2013-09-25 DIAGNOSIS — N921 Excessive and frequent menstruation with irregular cycle: Secondary | ICD-10-CM | POA: Diagnosis present

## 2013-09-25 DIAGNOSIS — D649 Anemia, unspecified: Secondary | ICD-10-CM | POA: Insufficient documentation

## 2013-09-25 DIAGNOSIS — D252 Subserosal leiomyoma of uterus: Secondary | ICD-10-CM | POA: Insufficient documentation

## 2013-09-25 DIAGNOSIS — E785 Hyperlipidemia, unspecified: Secondary | ICD-10-CM | POA: Insufficient documentation

## 2013-09-25 DIAGNOSIS — Z6832 Body mass index (BMI) 32.0-32.9, adult: Secondary | ICD-10-CM | POA: Insufficient documentation

## 2013-09-25 DIAGNOSIS — D250 Submucous leiomyoma of uterus: Secondary | ICD-10-CM | POA: Insufficient documentation

## 2013-09-25 DIAGNOSIS — R51 Headache: Secondary | ICD-10-CM | POA: Insufficient documentation

## 2013-09-25 DIAGNOSIS — D251 Intramural leiomyoma of uterus: Secondary | ICD-10-CM | POA: Insufficient documentation

## 2013-09-25 HISTORY — PX: LAPAROSCOPIC ASSISTED VAGINAL HYSTERECTOMY: SHX5398

## 2013-09-25 LAB — PREGNANCY, URINE: Preg Test, Ur: NEGATIVE

## 2013-09-25 SURGERY — HYSTERECTOMY, VAGINAL, LAPAROSCOPY-ASSISTED
Anesthesia: General

## 2013-09-25 MED ORDER — MENTHOL 3 MG MT LOZG
1.0000 | LOZENGE | OROMUCOSAL | Status: DC | PRN
  Administered 2013-09-26: 3 mg via ORAL
  Filled 2013-09-25: qty 9

## 2013-09-25 MED ORDER — SODIUM CHLORIDE 0.9 % IJ SOLN: 9.0000 mL | INTRAMUSCULAR | Status: DC | PRN

## 2013-09-25 MED ORDER — PROPOFOL 10 MG/ML IV EMUL
INTRAVENOUS | Status: AC
  Filled 2013-09-25: qty 20

## 2013-09-25 MED ORDER — ACETAMINOPHEN 160 MG/5ML PO SOLN
ORAL | Status: AC
  Filled 2013-09-25: qty 40.6

## 2013-09-25 MED ORDER — KETOROLAC TROMETHAMINE 30 MG/ML IJ SOLN
30.0000 mg | Freq: Four times a day (QID) | INTRAMUSCULAR | Status: AC
  Administered 2013-09-25 – 2013-09-26 (×3): 30 mg via INTRAVENOUS
  Filled 2013-09-25 (×3): qty 1

## 2013-09-25 MED ORDER — NALOXONE HCL 0.4 MG/ML IJ SOLN: 0.4000 mg | INTRAMUSCULAR | Status: DC | PRN

## 2013-09-25 MED ORDER — DEXAMETHASONE SODIUM PHOSPHATE 10 MG/ML IJ SOLN
INTRAMUSCULAR | Status: AC
  Filled 2013-09-25: qty 1

## 2013-09-25 MED ORDER — ONDANSETRON HCL 4 MG/2ML IJ SOLN
INTRAMUSCULAR | Status: AC
  Filled 2013-09-25: qty 2

## 2013-09-25 MED ORDER — FENTANYL CITRATE 0.05 MG/ML IJ SOLN
INTRAMUSCULAR | Status: AC
  Filled 2013-09-25: qty 5

## 2013-09-25 MED ORDER — ROCURONIUM BROMIDE 100 MG/10ML IV SOLN
INTRAVENOUS | Status: DC | PRN
  Administered 2013-09-25: 50 mg via INTRAVENOUS

## 2013-09-25 MED ORDER — MIDAZOLAM HCL 2 MG/2ML IJ SOLN
INTRAMUSCULAR | Status: AC
  Filled 2013-09-25: qty 2

## 2013-09-25 MED ORDER — DIPHENHYDRAMINE HCL 50 MG/ML IJ SOLN: 12.5000 mg | Freq: Four times a day (QID) | INTRAMUSCULAR | Status: DC | PRN

## 2013-09-25 MED ORDER — MEPERIDINE HCL 25 MG/ML IJ SOLN: 6.2500 mg | INTRAMUSCULAR | Status: DC | PRN

## 2013-09-25 MED ORDER — KETOROLAC TROMETHAMINE 30 MG/ML IJ SOLN
INTRAMUSCULAR | Status: AC
  Filled 2013-09-25: qty 1

## 2013-09-25 MED ORDER — SERTRALINE HCL 100 MG PO TABS
100.0000 mg | ORAL_TABLET | Freq: Every day | ORAL | Status: DC
  Administered 2013-09-25: 50 mg via ORAL
  Filled 2013-09-25 (×2): qty 1

## 2013-09-25 MED ORDER — NEOSTIGMINE METHYLSULFATE 10 MG/10ML IV SOLN
INTRAVENOUS | Status: DC | PRN
  Administered 2013-09-25: 2 mg via INTRAVENOUS

## 2013-09-25 MED ORDER — METOCLOPRAMIDE HCL 5 MG/ML IJ SOLN: 10.0000 mg | Freq: Four times a day (QID) | INTRAMUSCULAR | Status: DC | PRN

## 2013-09-25 MED ORDER — ONDANSETRON HCL 4 MG/2ML IJ SOLN
4.0000 mg | Freq: Four times a day (QID) | INTRAMUSCULAR | Status: DC | PRN
  Administered 2013-09-25: 4 mg via INTRAVENOUS
  Filled 2013-09-25: qty 2

## 2013-09-25 MED ORDER — OXYCODONE-ACETAMINOPHEN 5-325 MG PO TABS
1.0000 | ORAL_TABLET | ORAL | Status: DC | PRN
  Administered 2013-09-26: 1 via ORAL
  Filled 2013-09-25: qty 1

## 2013-09-25 MED ORDER — NEOSTIGMINE METHYLSULFATE 10 MG/10ML IV SOLN
INTRAVENOUS | Status: AC
  Filled 2013-09-25: qty 1

## 2013-09-25 MED ORDER — EPHEDRINE SULFATE 50 MG/ML IJ SOLN
INTRAMUSCULAR | Status: DC | PRN
  Administered 2013-09-25: 10 mg via INTRAVENOUS

## 2013-09-25 MED ORDER — ESTRADIOL 0.1 MG/GM VA CREA
TOPICAL_CREAM | VAGINAL | Status: DC | PRN
  Administered 2013-09-25: 1 via VAGINAL

## 2013-09-25 MED ORDER — PROMETHAZINE HCL 25 MG/ML IJ SOLN
12.5000 mg | Freq: Four times a day (QID) | INTRAMUSCULAR | Status: DC | PRN
  Administered 2013-09-26: 12.5 mg via INTRAVENOUS
  Filled 2013-09-25: qty 1

## 2013-09-25 MED ORDER — FENTANYL CITRATE 0.05 MG/ML IJ SOLN
INTRAMUSCULAR | Status: DC | PRN
  Administered 2013-09-25 (×5): 50 ug via INTRAVENOUS
  Administered 2013-09-25 (×2): 100 ug via INTRAVENOUS
  Administered 2013-09-25: 50 ug via INTRAVENOUS

## 2013-09-25 MED ORDER — HYDROMORPHONE HCL PF 1 MG/ML IJ SOLN
INTRAMUSCULAR | Status: AC
  Filled 2013-09-25: qty 1

## 2013-09-25 MED ORDER — METOCLOPRAMIDE HCL 5 MG/ML IJ SOLN
INTRAMUSCULAR | Status: AC
  Administered 2013-09-25: 10 mg
  Filled 2013-09-25: qty 2

## 2013-09-25 MED ORDER — ONDANSETRON HCL 4 MG/2ML IJ SOLN
INTRAMUSCULAR | Status: DC | PRN
  Administered 2013-09-25: 4 mg via INTRAVENOUS

## 2013-09-25 MED ORDER — IBUPROFEN 600 MG PO TABS: 600.0000 mg | ORAL_TABLET | Freq: Four times a day (QID) | ORAL | Status: DC | PRN

## 2013-09-25 MED ORDER — MIDAZOLAM HCL 5 MG/5ML IJ SOLN
INTRAMUSCULAR | Status: DC | PRN
  Administered 2013-09-25: 2 mg via INTRAVENOUS

## 2013-09-25 MED ORDER — SIMVASTATIN 40 MG PO TABS
40.0000 mg | ORAL_TABLET | Freq: Every day | ORAL | Status: DC
  Administered 2013-09-25: 40 mg via ORAL
  Filled 2013-09-25: qty 1

## 2013-09-25 MED ORDER — PROPOFOL 10 MG/ML IV BOLUS
INTRAVENOUS | Status: DC | PRN
  Administered 2013-09-25: 160 mg via INTRAVENOUS

## 2013-09-25 MED ORDER — SCOPOLAMINE 1 MG/3DAYS TD PT72
MEDICATED_PATCH | TRANSDERMAL | Status: AC
  Administered 2013-09-25: 1.5 mg via TRANSDERMAL
  Filled 2013-09-25: qty 1

## 2013-09-25 MED ORDER — METOCLOPRAMIDE HCL 5 MG/ML IJ SOLN: 10.0000 mg | Freq: Once | INTRAMUSCULAR | Status: DC | PRN

## 2013-09-25 MED ORDER — SODIUM CHLORIDE 0.9 % IJ SOLN
INTRAMUSCULAR | Status: AC
  Filled 2013-09-25: qty 100

## 2013-09-25 MED ORDER — KETOROLAC TROMETHAMINE 30 MG/ML IJ SOLN
INTRAMUSCULAR | Status: DC | PRN
  Administered 2013-09-25: 30 mg via INTRAVENOUS

## 2013-09-25 MED ORDER — DIPHENHYDRAMINE HCL 12.5 MG/5ML PO ELIX: 12.5000 mg | ORAL_SOLUTION | Freq: Four times a day (QID) | ORAL | Status: DC | PRN

## 2013-09-25 MED ORDER — EPHEDRINE 5 MG/ML INJ
INTRAVENOUS | Status: AC
  Filled 2013-09-25: qty 10

## 2013-09-25 MED ORDER — SCOPOLAMINE 1 MG/3DAYS TD PT72
1.0000 | MEDICATED_PATCH | TRANSDERMAL | Status: DC
  Administered 2013-09-25: 1.5 mg via TRANSDERMAL

## 2013-09-25 MED ORDER — GLYCOPYRROLATE 0.2 MG/ML IJ SOLN
INTRAMUSCULAR | Status: DC | PRN
  Administered 2013-09-25: 0.4 mg via INTRAVENOUS

## 2013-09-25 MED ORDER — LIDOCAINE HCL (CARDIAC) 20 MG/ML IV SOLN
INTRAVENOUS | Status: DC | PRN
  Administered 2013-09-25: 50 mg via INTRAVENOUS

## 2013-09-25 MED ORDER — GLYCOPYRROLATE 0.2 MG/ML IJ SOLN
INTRAMUSCULAR | Status: AC
  Filled 2013-09-25: qty 4

## 2013-09-25 MED ORDER — ESTRADIOL 0.1 MG/GM VA CREA
TOPICAL_CREAM | VAGINAL | Status: AC
  Filled 2013-09-25: qty 42.5

## 2013-09-25 MED ORDER — ACETAMINOPHEN 160 MG/5ML PO SOLN
975.0000 mg | Freq: Once | ORAL | Status: AC
  Administered 2013-09-25: 975 mg via ORAL

## 2013-09-25 MED ORDER — DEXAMETHASONE SODIUM PHOSPHATE 10 MG/ML IJ SOLN
INTRAMUSCULAR | Status: DC | PRN
  Administered 2013-09-25: 10 mg via INTRAVENOUS

## 2013-09-25 MED ORDER — VASOPRESSIN 20 UNIT/ML IJ SOLN
INTRAMUSCULAR | Status: AC
  Filled 2013-09-25: qty 1

## 2013-09-25 MED ORDER — LIDOCAINE HCL (CARDIAC) 20 MG/ML IV SOLN
INTRAVENOUS | Status: AC
  Filled 2013-09-25: qty 5

## 2013-09-25 MED ORDER — HYDROMORPHONE HCL PF 1 MG/ML IJ SOLN
INTRAMUSCULAR | Status: DC | PRN
  Administered 2013-09-25 (×2): 0.5 mg via INTRAVENOUS

## 2013-09-25 MED ORDER — ONDANSETRON HCL 4 MG PO TABS: 4.0000 mg | ORAL_TABLET | Freq: Three times a day (TID) | ORAL | Status: DC | PRN

## 2013-09-25 MED ORDER — LACTATED RINGERS IV SOLN
INTRAVENOUS | Status: DC
  Administered 2013-09-25 – 2013-09-26 (×4): via INTRAVENOUS

## 2013-09-25 MED ORDER — SODIUM CHLORIDE 0.9 % IV SOLN
INTRAVENOUS | Status: DC | PRN
  Administered 2013-09-25: 09:00:00 via INTRAMUSCULAR

## 2013-09-25 MED ORDER — LACTATED RINGERS IV SOLN
INTRAVENOUS | Status: DC
  Administered 2013-09-25 (×5): via INTRAVENOUS

## 2013-09-25 MED ORDER — 0.9 % SODIUM CHLORIDE (POUR BTL) OPTIME
TOPICAL | Status: DC | PRN
  Administered 2013-09-25: 1000 mL

## 2013-09-25 MED ORDER — HYDROMORPHONE HCL PF 1 MG/ML IJ SOLN
0.2500 mg | INTRAMUSCULAR | Status: DC | PRN
Start: 2013-09-25 — End: 2013-09-25
  Administered 2013-09-25 (×2): 0.5 mg via INTRAVENOUS

## 2013-09-25 MED ORDER — HYDROMORPHONE 0.3 MG/ML IV SOLN
INTRAVENOUS | Status: DC
  Administered 2013-09-25: 13:00:00 via INTRAVENOUS
  Administered 2013-09-25: 2.1 mg via INTRAVENOUS
  Administered 2013-09-25: 2.7 mg via INTRAVENOUS
  Administered 2013-09-26 (×2): 0.6 mg via INTRAVENOUS
  Administered 2013-09-26: 1.45 mg via INTRAVENOUS
  Filled 2013-09-25 (×2): qty 25

## 2013-09-25 SURGICAL SUPPLY — 84 items
ADH SKN CLS APL DERMABOND .7 (GAUZE/BANDAGES/DRESSINGS)
CABLE HIGH FREQUENCY MONO STRZ (ELECTRODE) IMPLANT
CANISTER SUCT 3000ML (MISCELLANEOUS) ×3 IMPLANT
CATH ROBINSON RED A/P 16FR (CATHETERS) IMPLANT
CLOSURE WOUND 1/4 X3 (GAUZE/BANDAGES/DRESSINGS)
CLOTH BEACON ORANGE TIMEOUT ST (SAFETY) ×3 IMPLANT
CONT PATH 16OZ SNAP LID 3702 (MISCELLANEOUS) ×3 IMPLANT
CONT SPECI 4OZ STER CLIK (MISCELLANEOUS) IMPLANT
COVER TABLE BACK 60X90 (DRAPES) ×1 IMPLANT
DECANTER SPIKE VIAL GLASS SM (MISCELLANEOUS) ×4 IMPLANT
DERMABOND ADVANCED (GAUZE/BANDAGES/DRESSINGS)
DERMABOND ADVANCED .7 DNX12 (GAUZE/BANDAGES/DRESSINGS) IMPLANT
DRAIN JACKSON PRT FLT 7MM (DRAIN) IMPLANT
DRAPE LG THREE QUARTER DISP (DRAPES) ×6 IMPLANT
DRAPE WARM FLUID 44X44 (DRAPE) IMPLANT
DRSG COVADERM PLUS 2X2 (GAUZE/BANDAGES/DRESSINGS) ×2 IMPLANT
DRSG OPSITE POSTOP 3X4 (GAUZE/BANDAGES/DRESSINGS) IMPLANT
DRSG OPSITE POSTOP 4X10 (GAUZE/BANDAGES/DRESSINGS) ×1 IMPLANT
DURAPREP 26ML APPLICATOR (WOUND CARE) ×3 IMPLANT
ELECT CAUTERY BLADE 6.4 (BLADE) IMPLANT
ELECT NDL TIP 2.8 STRL (NEEDLE) IMPLANT
ELECT NEEDLE TIP 2.8 STRL (NEEDLE) IMPLANT
ELECT REM PT RETURN 9FT ADLT (ELECTROSURGICAL) ×3
ELECTRODE REM PT RTRN 9FT ADLT (ELECTROSURGICAL) ×1 IMPLANT
EVACUATOR SILICONE 100CC (DRAIN) IMPLANT
EVACUATOR SMOKE 8.L (FILTER) ×1 IMPLANT
FORCEPS CUTTING 33CM 5MM (CUTTING FORCEPS) IMPLANT
FORCEPS CUTTING 45CM 5MM (CUTTING FORCEPS) IMPLANT
GAUZE PACKING 2X5 YD STRL (GAUZE/BANDAGES/DRESSINGS) ×2 IMPLANT
GAUZE SPONGE 4X4 16PLY XRAY LF (GAUZE/BANDAGES/DRESSINGS) ×3 IMPLANT
GLOVE BIOGEL PI IND STRL 6.5 (GLOVE) ×1 IMPLANT
GLOVE BIOGEL PI INDICATOR 6.5 (GLOVE) ×2
GLOVE SURG SS PI 6.5 STRL IVOR (GLOVE) ×9 IMPLANT
GOWN STRL REUS W/ TWL LRG LVL3 (GOWN DISPOSABLE) ×7 IMPLANT
GOWN STRL REUS W/TWL LRG LVL3 (GOWN DISPOSABLE) ×30 IMPLANT
NDL HYPO 25X1 1.5 SAFETY (NEEDLE) IMPLANT
NDL SPNL 22GX3.5 QUINCKE BK (NEEDLE) ×1 IMPLANT
NEEDLE HYPO 25X1 1.5 SAFETY (NEEDLE) IMPLANT
NEEDLE MAYO .5 CIRCLE (NEEDLE) ×3 IMPLANT
NEEDLE SPNL 22GX3.5 QUINCKE BK (NEEDLE) ×3 IMPLANT
NS IRRIG 1000ML POUR BTL (IV SOLUTION) ×3 IMPLANT
PACK ABDOMINAL GYN (CUSTOM PROCEDURE TRAY) ×1 IMPLANT
PACK LAVH (CUSTOM PROCEDURE TRAY) ×1 IMPLANT
PACK VAGINAL MINOR WOMEN LF (CUSTOM PROCEDURE TRAY) ×2 IMPLANT
PAD MAGNETIC INST (MISCELLANEOUS) IMPLANT
PAD OB MATERNITY 4.3X12.25 (PERSONAL CARE ITEMS) ×3 IMPLANT
PROTECTOR NERVE ULNAR (MISCELLANEOUS) ×1 IMPLANT
SET IRRIG TUBING LAPAROSCOPIC (IRRIGATION / IRRIGATOR) IMPLANT
SHEARS HARMONIC ACE PLUS 36CM (ENDOMECHANICALS) IMPLANT
SOLUTION ELECTROLUBE (MISCELLANEOUS) IMPLANT
SPONGE LAP 18X18 X RAY DECT (DISPOSABLE) ×2 IMPLANT
STAPLER VISISTAT 35W (STAPLE) IMPLANT
STRIP CLOSURE SKIN 1/4X3 (GAUZE/BANDAGES/DRESSINGS) IMPLANT
SUT CHROMIC 2 0 TIES 18 (SUTURE) IMPLANT
SUT MNCRL AB 3-0 PS2 27 (SUTURE) IMPLANT
SUT PDS AB 1 CT  36 (SUTURE)
SUT PDS AB 1 CT 36 (SUTURE) IMPLANT
SUT PLAIN 2 0 XLH (SUTURE) IMPLANT
SUT SILK 0 FSL (SUTURE) IMPLANT
SUT VIC AB 0 CT1 18XCR BRD8 (SUTURE) ×3 IMPLANT
SUT VIC AB 0 CT1 27 (SUTURE)
SUT VIC AB 0 CT1 27XBRD ANBCTR (SUTURE) IMPLANT
SUT VIC AB 0 CT1 8-18 (SUTURE) ×9
SUT VIC AB 2-0 CT1 (SUTURE) ×1 IMPLANT
SUT VIC AB 2-0 SH 27 (SUTURE) ×3
SUT VIC AB 2-0 SH 27XBRD (SUTURE) ×2 IMPLANT
SUT VIC AB 3-0 PS2 18 (SUTURE)
SUT VIC AB 3-0 PS2 18XBRD (SUTURE) ×1 IMPLANT
SUT VIC AB 3-0 SH 27 (SUTURE)
SUT VIC AB 3-0 SH 27X BRD (SUTURE) IMPLANT
SUT VICRYL 0 ENDOLOOP (SUTURE) IMPLANT
SUT VICRYL 0 TIES 12 18 (SUTURE) ×3 IMPLANT
SUT VICRYL 0 UR6 27IN ABS (SUTURE) IMPLANT
SYR 20CC LL (SYRINGE) ×3 IMPLANT
SYR CONTROL 10ML LL (SYRINGE) IMPLANT
SYR TB 1ML 25GX5/8 (SYRINGE) IMPLANT
SYR TB 1ML LUER SLIP (SYRINGE) ×3 IMPLANT
SYRINGE 60CC LL (MISCELLANEOUS) ×1 IMPLANT
TOWEL OR 17X24 6PK STRL BLUE (TOWEL DISPOSABLE) ×6 IMPLANT
TRAY FOLEY CATH 14FR (SET/KITS/TRAYS/PACK) ×3 IMPLANT
TROCAR BALL TOP DISP 5MM (ENDOMECHANICALS) ×1 IMPLANT
TROCAR XCEL DIL TIP R 11M (ENDOMECHANICALS) ×1 IMPLANT
WARMER LAPAROSCOPE (MISCELLANEOUS) ×1 IMPLANT
WATER STERILE IRR 1000ML POUR (IV SOLUTION) ×3 IMPLANT

## 2013-09-25 NOTE — Anesthesia Postprocedure Evaluation (Signed)
  Anesthesia Post-op Note  Patient: Kristen [name]  Procedure(s) Performed: Procedure(s): LAPAROSCOPIC ASSISTED VAGINAL HYSTERECTOMY (N/A) Patient is awake and responsive. Pain and nausea are reasonably well controlled. Vital signs are stable and clinically acceptable. Oxygen saturation is clinically acceptable. There are no apparent anesthetic complications at this time. Patient is ready for discharge.

## 2013-09-25 NOTE — Addendum Note (Signed)
Addendum created 09/25/13 1818 by Flossie Dibble, CRNA   Modules edited: Notes Section   Notes Section:  File: [id number]

## 2013-09-25 NOTE — H&P (Signed)
  History and Physical Interval Note:   09/25/2013   7:41 AM   Kristen [name]  has presented today for surgery, with the diagnosis of Menorrhagia, Uterine Fibroids  The various methods of treatment have been discussed with the patient and family. After consideration of risks, benefits and other options for treatment, the patient has consented to  Procedure(s): TOTAL VAGINAL HYSTERECTOMY possible ABDOMINAL HYSTERECTOMY.  BILATERAL SALPINGECTOMY  If possible,  as a surgical intervention .  I have reviewed the patients' chart and labs.  Questions were answered to the patient's satisfaction.     Eldred Manges  MD

## 2013-09-25 NOTE — Op Note (Signed)
OPERATIVE REPORT   INDICATIONS:abnormal uterine bleeding and fibroids  PRE-OP DIAGNOSIS:Menorrhagia, Uterine Fibroids   POST-OP DIAGNOSIS;Menorrhagia, Uterine Fibroids   PROCEDURE: Total vaginal hysterectomy  SURGEON:Rosio Weiss P  ASSIST:  Earnstine Regal certified physician Asst.  SPECIMENS: Uterus and cervix  DISPOSITION OF SPECIMEN:Pathology  EBL: 712 cc  COMPLICATIONS: None  PATIENT TO:  PACU - hemodynamically stable.  PROCEDURE DETAILS: The patient was taken to the operating room after appropriate identification and placed on the operating table.  The patient was placed in the lithotomy position while awake. The patient then underwent induction of general anesthesia.. The perineum and vagina were prepped with multiple layers of Betadine. A Foley catheter was inserted into the bladder and connected to straight drainage.  the abdomen was prepped with chlor prep in case total abdominal hysterectomy is required..The perineum was draped as a sterile field. A weighted speculum was placed in the posterior vagina and Lahey tenaculae were placed on the anterior and posterior surfaces of the cervix. The cervicovaginal mucosa was injected with a dilute solution of Pitressin. The cervix was circumscribed. The anterior vaginal mucosa wasbluntly dissected off the anterior cervix and the anterior peritoneum entered. The bladder blade was placed and the bladder elevated. The posterior peritoneum was entered sharply and tagged. Uterosacral ligaments on the right and left side were clamped cut and suture ligated, and the sutures held. The paracervical tissues were then clamped cut and suture ligated. The uterine arteries were clamped cut and suture ligated. The parametral tissues were clamped, cut, and suture ligated.The uterus was inverted and the upper pedicles clamped cut and doubly suture-ligated. The uterus and cervix were removed from the operative field. A bleeder in the left upper pedicles  was identified and suture ligated to achieve adequate hemostasis. The vaginal angles were created with the sutures from the  uterosacral ligaments that had been held and placed through the anterior and posterior surfaces of the vagina then tied down. The remainder of the vaginal cuff was closed with figure-of-eight sutures of.  All sutures used were 0 Vicryl.  Once the vaginal cuff was completely closed. Hemostasis was noted to be adequate.. A 2 inch plain vaginal packing moistened with Estrace cream was placed in the vagina. The patient was awakened from general anesthesia and taken to the recovery room in satisfactory condition having tolerated the procedure well the sponge and instrument counts correct.  Eldred Manges, MD 11:50 AM

## 2013-09-25 NOTE — Anesthesia Preprocedure Evaluation (Signed)
Anesthesia Evaluation  Patient identified by MRN, date of birth, ID band Patient awake    Reviewed: Allergy & Precautions, H&P , NPO status , Patient's Chart, lab work & pertinent test results  History of Anesthesia Complications (+) PONV and history of anesthetic complications  Airway Mallampati: III TM Distance: >3 FB Neck ROM: Full    Dental no notable dental hx. (+) Teeth Intact   Pulmonary neg pulmonary ROS,  breath sounds clear to auscultation  Pulmonary exam normal       Cardiovascular negative cardio ROS  Rhythm:Regular Rate:Normal     Neuro/Psych  Headaches, negative psych ROS   GI/Hepatic negative GI ROS, Neg liver ROS,   Endo/Other  PCOS Hyperlipidemia Obesity  Renal/GU negative Renal ROS  negative genitourinary   Musculoskeletal   Abdominal (+) + obese,   Peds  Hematology   Anesthesia Other Findings   Reproductive/Obstetrics Uterine fibroids menorrhagia                           Anesthesia Physical Anesthesia Plan  ASA: II  Anesthesia Plan: General   Post-op Pain Management:    Induction: Intravenous  Airway Management Planned: Oral ETT  Additional Equipment:   Intra-op Plan:   Post-operative Plan: Extubation in OR  Informed Consent: I have reviewed the patients History and Physical, chart, labs and discussed the procedure including the risks, benefits and alternatives for the proposed anesthesia with the patient or authorized representative who has indicated his/her understanding and acceptance.   Dental advisory given  Plan Discussed with: CRNA, Anesthesiologist and Surgeon  Anesthesia Plan Comments:         Anesthesia Quick Evaluation

## 2013-09-25 NOTE — Anesthesia Postprocedure Evaluation (Signed)
Anesthesia Post Note  Patient: Kristen [name]  Procedure(s) Performed: Procedure(s): LAPAROSCOPIC ASSISTED VAGINAL HYSTERECTOMY (N/A)  Anesthesia type: General  Patient location: [facility]  Post pain: Pain level controlled  Post assessment: Post-op Vital signs reviewed  Last Vitals: BP 111/51  Pulse 92  Temp(Src) 36.4 C (Oral)  Resp 15  SpO2 97%  Post vital signs: Reviewed  Level of consciousness: awake  Complications: No apparent anesthesia complications

## 2013-09-25 NOTE — Transfer of Care (Signed)
Immediate Anesthesia Transfer of Care Note  Patient: Kristen [name]  Procedure(s) Performed: Procedure(s): LAPAROSCOPIC ASSISTED VAGINAL HYSTERECTOMY (N/A)  Patient Location: PACU  Anesthesia Type:General  Level of Consciousness: sedated  Airway & Oxygen Therapy: Patient Spontanous Breathing and Patient connected to nasal cannula oxygen  Post-op Assessment: Report given to PACU RN and Post -op Vital signs reviewed and stable  Post vital signs: stable  Complications: No apparent anesthesia complications

## 2013-09-26 ENCOUNTER — Encounter (HOSPITAL_COMMUNITY): Payer: Self-pay | Admitting: Obstetrics and Gynecology

## 2013-09-26 DIAGNOSIS — D500 Iron deficiency anemia secondary to blood loss (chronic): Secondary | ICD-10-CM | POA: Diagnosis present

## 2013-09-26 LAB — CBC
HCT: 26.5 % — ABNORMAL LOW (ref 36.0–46.0)
Hemoglobin: 8.2 g/dL — ABNORMAL LOW (ref 12.0–15.0)
MCH: 24.8 pg — AB (ref 26.0–34.0)
MCHC: 30.9 g/dL (ref 30.0–36.0)
MCV: 80.3 fL (ref 78.0–100.0)
PLATELETS: 236 10*3/uL (ref 150–400)
RBC: 3.3 MIL/uL — ABNORMAL LOW (ref 3.87–5.11)
RDW: 14.9 % (ref 11.5–15.5)
WBC: 15.6 10*3/uL — ABNORMAL HIGH (ref 4.0–10.5)

## 2013-09-26 MED ORDER — FERROUS SULFATE ER 143 (45 FE) MG PO TBCR: 1.0000 | EXTENDED_RELEASE_TABLET | Freq: Two times a day (BID) | ORAL | Status: DC

## 2013-09-26 MED ORDER — PROMETHAZINE HCL 12.5 MG PO TABS: 12.5000 mg | ORAL_TABLET | Freq: Four times a day (QID) | ORAL | Status: DC | PRN

## 2013-09-26 MED ORDER — ONDANSETRON HCL 4 MG PO TABS: 4.0000 mg | ORAL_TABLET | Freq: Three times a day (TID) | ORAL | Status: DC | PRN

## 2013-09-26 MED ORDER — OXYCODONE-ACETAMINOPHEN 5-325 MG PO TABS: 1.0000 | ORAL_TABLET | Freq: Four times a day (QID) | ORAL | Status: DC | PRN

## 2013-09-26 MED ORDER — IBUPROFEN 600 MG PO TABS: ORAL_TABLET | ORAL | Status: DC

## 2013-09-26 MED ORDER — OXYCODONE-ACETAMINOPHEN 5-325 MG PO TABS: 1.0000 | ORAL_TABLET | ORAL | Status: DC | PRN

## 2013-09-26 MED ORDER — DOCUSATE SODIUM 100 MG PO CAPS: 100.0000 mg | ORAL_CAPSULE | Freq: Two times a day (BID) | ORAL | Status: DC

## 2013-09-26 NOTE — Discharge Instructions (Signed)
Call [facility] @ [phone] if:  You have a temperature greater than or equal to 100.4 degrees Farenheit orally You have pain that is not made better by the pain medication given and taken as directed You have excessive bleeding or problems urinating  Take Colace (Docusate Sodium/Stool Softener) 100 mg 2-3 times daily while taking narcotic pain medicine to avoid constipation or until bowel movements are regular. Take your choice of over the counter iron supplements,  twice daily, for the next 12 weeks  You may drive after 2 weeks You may walk up steps You may shower tomorrow You may resume a regular diet   Do not lift over 15 pounds for 6 weeks Avoid anything in vagina for 6 weeks (or until after your post-operative visit)  Keep follow up appointment with Dr. Leo Grosser on October 22, 2013 at 2 p.m.

## 2013-09-26 NOTE — Progress Notes (Signed)
Pt discharged home with husband... Discharge instructions given and reviewed with pt & she verbalized understanding... Condition stable... No equipment... Ambulated to car with Janalyn Shy, RN.

## 2013-09-26 NOTE — Discharge Summary (Signed)
Physician Discharge Summary  Patient ID: Kristen [name] MRN: [id number] DOB/AGE: 45-Feb-1972 45 y.o.  Admit date: 09/25/2013 Discharge date: 09/26/2013   Discharge Diagnoses: Symptomatic Uterine Fibroids and Menorrhagia Active Problems:   POLYCYSTIC OVARIAN DISEASE   Fibroids   Menorrhagia with irregular cycle   Operation: Total Vaginal Hysterectomy   Discharged Condition: Good  Hospital Course: On the date of admission the patient underwent the aforementioned procedure and tolerated it well.  Post operative course was unremarkable with the patient tolerating a hemoglobin of 8.2 and resuming bowel and bladder function by post operative day #1. She had adequate pain relief from her analgesic regimen.  Having received the maximum benefit of her hospital stay,  the patient was discharged home on post operative day #1.  Disposition: 01-Home or Self Care  Discharge Medications:    Medication List    STOP taking these medications       norethindrone-ethinyl estradiol 0.5-35 MG-MCG tablet  Commonly known as:  NECON,BREVICON,MODICON      TAKE these medications       azithromycin 250 MG tablet  Commonly known as:  ZITHROMAX  Take by mouth daily.     fish oil-omega-3 fatty acids 1000 MG capsule  Take 1 g by mouth daily.     ibuprofen 600 MG tablet  Commonly known as:  ADVIL,MOTRIN  1  po pc every 6 hours for 5 days then prn-pain     multivitamin with minerals Tabs tablet  Take 1 tablet by mouth daily.     oxyCODONE-acetaminophen 5-325 MG per tablet  Commonly known as:  PERCOCET/ROXICET  Take 1-2 tablets by mouth every 6 (six) hours as needed for severe pain (moderate to severe pain (when tolerating fluids)).     promethazine 12.5 MG tablet  Commonly known as:  PHENERGAN  Take 1 tablet (12.5 mg total) by mouth every 6 (six) hours as needed for nausea or vomiting.     sertraline 100 MG tablet  Commonly known as:  ZOLOFT  Take 1/2 tablet by mouth daily     simvastatin 40  MG tablet  Commonly known as:  ZOCOR  Take 1/2 tablet by mouth daily     vitamin C 500 MG tablet  Commonly known as:  ASCORBIC ACID  Take 500 mg by mouth daily.     vitamin E 100 UNIT capsule  Take 100 Units by mouth daily.          Follow-up: Dr. Lorriane Shire P. Jayne Peckenpaugh on October 22, 2013 at 2 p.m.  SignedEarnstine Regal, PA-C 09/26/2013, 7:42 AM

## 2013-09-26 NOTE — Progress Notes (Signed)
Kristen [name] is a17 y.o.  [id number]  Post Op Date # 1:  TVH  Subjective: Patient is Doing well postoperatively. Patient has Pain is controlled with current analgesics. Medications being used: PCA  Hydromorphone and Ketorolac.., Admits to dizziness when she first ambulated last night but none this morning. Has had some nausea, off & on but resolved with promethazine.  Tolerating liquids/crackers, ambulating, voided 600 cc without difficulty and ordering breakfast.   Objective: Vital signs in last 24 hours: Temp:  [97.5 F (36.4 C)-98.5 F (36.9 C)] 98.2 F (36.8 C) (07/16 0549) Pulse Rate:  [75-111] 89 (07/16 0714) Resp:  [15-20] 17 (07/16 0549) BP: (102-142)/(51-86) 124/75 mmHg (07/16 0714) SpO2:  [96 %-100 %] 96 % (07/16 0549)  Intake/Output from previous day: 07/15 0701 - 07/16 0700 In: 8266.3 [P.O.:1030; I.V.:7236.3] Out: 3625 [Urine:2925] Intake/Output this shift: Total I/O In: -  Out: 600 [Urine:600]  Recent Labs Lab 09/20/13 0950 09/26/13 0530  WBC 9.4 15.6*  HGB 10.9* 8.2*  HCT 34.6* 26.5*  PLT 286 236    No results found for this basename: NA, K, CL, CO2, BUN, CREATININE, CALCIUM, LABALBU, PROT, BILITOT, ALKPHOS, ALT, AST, GLUCOSE,  in the last 168 hours  EXAM: General: alert, cooperative and no distress Resp: clear to auscultation bilaterally Cardio: regular rate and rhythm, S1, S2 normal, no murmur, click, rub or gallop GI: Bowel sounds present Extremities: Homans sign is negative, no sign of DVT Vaginal Bleeding: none   Assessment: s/p Procedure(s): VAGINAL HYSTERECTOMY: stable, progressing well and anemia  Plan: Advance diet Encourage ambulation Advance to PO medication Consider diacharge home today.  LOS: 1 day    Britten Seyfried, PA-C 09/26/2013 7:23 AM

## 2013-09-26 NOTE — Progress Notes (Signed)
Vaginal packing removed as ordered.  Scant amount dark vaginal drainaged noted on packing.  Patient tolerated fair.  Foley cath removed as ordered.

## 2014-01-13 ENCOUNTER — Encounter (HOSPITAL_COMMUNITY): Payer: Self-pay | Admitting: Obstetrics and Gynecology

## 2014-03-26 ENCOUNTER — Ambulatory Visit: Payer: Self-pay | Admitting: Family Medicine

## 2014-06-13 ENCOUNTER — Ambulatory Visit: Payer: Self-pay | Admitting: Pulmonary Disease

## 2014-07-17 ENCOUNTER — Telehealth: Payer: Self-pay | Admitting: Pulmonary Disease

## 2014-07-17 ENCOUNTER — Other Ambulatory Visit: Payer: Self-pay

## 2014-07-17 DIAGNOSIS — Z1231 Encounter for screening mammogram for malignant neoplasm of breast: Secondary | ICD-10-CM

## 2014-07-17 MED ORDER — SERTRALINE HCL 100 MG PO TABS: ORAL_TABLET | ORAL | Status: DC

## 2014-07-17 NOTE — Telephone Encounter (Signed)
Pt aware RX sent in. Nothing further needed 

## 2014-07-25 ENCOUNTER — Ambulatory Visit
Admission: RE | Admit: 2014-07-25 | Discharge: 2014-07-25 | Disposition: A | Discharge status: Home / Self Care | Payer: BLUE CROSS/BLUE SHIELD | Source: Ambulatory Visit

## 2014-07-25 DIAGNOSIS — Z1231 Encounter for screening mammogram for malignant neoplasm of breast: Secondary | ICD-10-CM

## 2014-08-08 ENCOUNTER — Ambulatory Visit: Payer: Self-pay | Admitting: Pulmonary Disease

## 2014-08-15 ENCOUNTER — Ambulatory Visit (INDEPENDENT_AMBULATORY_CARE_PROVIDER_SITE_OTHER): Payer: BLUE CROSS/BLUE SHIELD | Admitting: Pulmonary Disease

## 2014-08-15 ENCOUNTER — Encounter: Payer: Self-pay | Admitting: Pulmonary Disease

## 2014-08-15 ENCOUNTER — Other Ambulatory Visit (INDEPENDENT_AMBULATORY_CARE_PROVIDER_SITE_OTHER): Payer: BLUE CROSS/BLUE SHIELD

## 2014-08-15 VITALS — BP 112/88 | HR 80 | Temp 97.8°F | Ht 61.0 in | Wt 160.0 lb

## 2014-08-15 DIAGNOSIS — R7989 Other specified abnormal findings of blood chemistry: Secondary | ICD-10-CM

## 2014-08-15 DIAGNOSIS — Z0000 Encounter for general adult medical examination without abnormal findings: Secondary | ICD-10-CM

## 2014-08-15 LAB — CBC WITH DIFFERENTIAL/PLATELET
Basophils Absolute: 0 10*3/uL (ref 0.0–0.1)
Basophils Relative: 0.5 % (ref 0.0–3.0)
EOS ABS: 0 10*3/uL (ref 0.0–0.7)
Eosinophils Relative: 0.4 % (ref 0.0–5.0)
HEMATOCRIT: 44.8 % (ref 36.0–46.0)
HEMOGLOBIN: 15.3 g/dL — AB (ref 12.0–15.0)
LYMPHS ABS: 3.1 10*3/uL (ref 0.7–4.0)
LYMPHS PCT: 35.1 % (ref 12.0–46.0)
MCHC: 34.2 g/dL (ref 30.0–36.0)
MCV: 89.1 fl (ref 78.0–100.0)
MONOS PCT: 5.9 % (ref 3.0–12.0)
Monocytes Absolute: 0.5 10*3/uL (ref 0.1–1.0)
NEUTROS ABS: 5.2 10*3/uL (ref 1.4–7.7)
Neutrophils Relative %: 58.1 % (ref 43.0–77.0)
Platelets: 271 10*3/uL (ref 150.0–400.0)
RBC: 5.03 Mil/uL (ref 3.87–5.11)
RDW: 12.9 % (ref 11.5–15.5)
WBC: 8.9 10*3/uL (ref 4.0–10.5)

## 2014-08-15 LAB — HEPATIC FUNCTION PANEL
ALBUMIN: 4.4 g/dL (ref 3.5–5.2)
ALT: 19 U/L (ref 0–35)
AST: 19 U/L (ref 0–37)
Alkaline Phosphatase: 59 U/L (ref 39–117)
BILIRUBIN DIRECT: 0.1 mg/dL (ref 0.0–0.3)
Total Bilirubin: 0.7 mg/dL (ref 0.2–1.2)
Total Protein: 7.4 g/dL (ref 6.0–8.3)

## 2014-08-15 LAB — LDL CHOLESTEROL, DIRECT: LDL DIRECT: 164 mg/dL

## 2014-08-15 LAB — BASIC METABOLIC PANEL
BUN: 10 mg/dL (ref 6–23)
CALCIUM: 9.5 mg/dL (ref 8.4–10.5)
CO2: 24 mEq/L (ref 19–32)
Chloride: 104 mEq/L (ref 96–112)
Creatinine, Ser: 0.63 mg/dL (ref 0.40–1.20)
GFR: 107.95 mL/min (ref >60.00)
Glucose, Bld: 91 mg/dL (ref 70–99)
POTASSIUM: 3.9 meq/L (ref 3.5–5.1)
Sodium: 135 mEq/L (ref 135–145)

## 2014-08-15 LAB — LIPID PANEL
Cholesterol: 250 mg/dL — ABNORMAL HIGH (ref 0–200)
HDL: 35.3 mg/dL — ABNORMAL LOW (ref >39.00)
NonHDL: 214.7
Total CHOL/HDL Ratio: 7
Triglycerides: 238 mg/dL — ABNORMAL HIGH (ref 0.0–149.0)
VLDL: 47.6 mg/dL — AB (ref 0.0–40.0)

## 2014-08-15 LAB — TSH: TSH: 1.83 u[IU]/mL (ref 0.35–4.50)

## 2014-08-15 NOTE — Progress Notes (Signed)
Subjective:     Patient ID: Kristen [name], female   DOB: 1970-03-31, 44 y.o.   MRN: [id number]  HPI 44 y/o WF here for a yearly follow up and CPX... she has hypercholestrolemia treated w/ Simva20... ~  SEE PREV EPIC NOTES FOR OLDER DATA >>   ~  April 13, 2012:  Yearly ROV & CPX> Kristen [name] reports "a few minor issues" over the past yr- mostly revolving around the orthopedic area w/ bursitis in hip, shoulder pain w/ "part dislocation" from sleeping wrong, knee pain, plantar fasciitis, fall w/ bruised rib cage; she has been attended by ZachSmithDO (rec by her mother since he does natural cures) who gave her a shot in her hip, adjusted her shoulder, etc;  She also notes decr energy, not resting well, ?FM...    She has gained 6# up to 175# and FLP is worse w/ incr TG to 413; needs better diet, get wt down & start Fenofibrate160/d...    She continues f/u w/ GYN DrHaygood due to hx PCOS as noted below; remains on VitD 50K weekly w/ lab by GYN she says...    Hx migaine HAs on Topamax, Imitrex; prev on Midrin as needed but she tells me she's stopped all pain meds; she used to see DrFreeman but is doing well now on stable regimen...     She continues to do well on Zoloft 50mg /d... We reviewed prob list, meds, xrays and labs> see below for updates >> she had the 2013 Flu vaccine 11/13; last TDAP 4/11; refills written...  LABS 1/14:  FLP- TChol 151, TG 413, HDL 33, LDL 70 on Simva20;  Chems- wnl;  CBC- wnl;  TSH=1.30   ~  April 15, 2013:  Yearly ROV & CPX> Kristen [name] reports a good yr- but she has had some bursitis in right shoulder, eval by DrZSmith w/ shot & exercises that have helped;  Sh had Abd pain 9/14- went to the ER to r/o GB, had CT Abd showing hep steatosis, 34mm calculous in right kidney, sl dilated sm bowel, uterine fibroids; treated w/ antibiotics and pain meds=> resolved; GYN is dealing w/ those issues... We reviewed the following medical problems during today's office visit >>     Hyperlipid> on  Simva40-1/2/d & diet; she refused Feno160 due to $$; FLP 2/15 showed TChol 170, TG 178, HDL 48, LDL 87... Needs better low fat diet & wt reduction...     Polycystic Ovarian Dis> followed by [facility] [place]- DrHaygood w/ heavy menses, on BCPs, we do not have notes from them...    ORTHO> followed for sports med by DrZSmith, bursitis in right shoulder w/ shot & exercises- improved...     VitD defic> on 50K weekly per Gyn but she stopped & using OTC supplement... VitD level done by Gyn she says...    Hx Migraines> on Mobic15 & Imitrex100 prn; off prev Topamax & Vicodin;     Hx anxiety/depression> on Zoloft25 & she is doing satis on this dose she says... We reviewed prob list, meds, xrays and labs> see below for updates >>   CXR 2/15 showed norm heart size, clear lungs, NAD.Marland KitchenMarland Kitchen  EKG 2/15 showed NSR, rate80, WNL.Marland KitchenMarland Kitchen  LABS 2/15:  FLP- ok x TG=178;  Chems- wnl;  CBC- wnl;  NOTE- TSH & VitD were done by her Gyn she says & we don't have those records...   ~  August 15, 2014:  73mo ROV & Kristen [name] is here for f/u CPX> she had a hysterectomy 7/15 by [facility] for  fibroids and menorrhagia; she also has polycystic ovarian dis & anemia w/ Hg=8.2 post op; she was on Fe & VitD Rx- monitored by Gyn & pt tells me she is better & off these supplements... We reviewed the following medical problems during today's office visit >>     Hyperlipid> off Simva20 now (joint pain) & on diet w/ 14# wt loss; FLP 6/16 showed TChol 250, TG 238, HDL 35, LDL 164; Rec to start Cres5 & recheck labs 39mo...    Polycystic Ovarian Dis> followed by Concha [place]- DrHaygood w/ prev heavy menses & fibroids=> s/p [facility] 7/15 and now off hormones on Estrace cream...    ORTHO> followed for sports med by DrZSmith, bursitis in right shoulder w/ shot & exercises- improved; has some LBP- on PT & exercises...     VitD defic> prev on 50K weekly per Gyn but off now & VitD level done by Gyn & ok per pt report...    Hx Migraines> prev on Mobic15 & Imitrex100 prn; off prev Topamax &  Vicodin; no HAs reported and she's taking a lot of supplements...    Hx anxiety/depression> on Zoloft50 & she is doing satis on this dose, she wants to wean off & suggested to cut to 1/2 then Qod til gone... 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 ~  [place] 9/08 showed TChol 146, TG 143, HDL 48, LDL 70... Chol is good on Simva20. ~  [place] 12/09 showed TChol 159, TG 165, HDL 34, LDL 92... reminder- low fat diet... ~  [place] 1/11 showed TChol 182, TG 294, HDL 48, LDL 75... OK on Simva20, may need Fibrate. ~  FLP 1/12 showed TChol 141, TG 216, HDL 46,LDL 70... contin same w/ low fat diet. ~  FLP 1/13 on Simva20 showed TChol 167, TG 194, HDL 45, LDL 83... rec same med, better low fat diet... ~  [place] 1/14 on Simva20 showed TChol 151, TG 413, HDL 33, LDL 70... rec to add FENOFIBRATE160/d=> she stopped due to $$ ~  FLP 2/15 on Simva20 showed TChol 170, TG 178, HDL 48, LDL 87 ~  NOTE> she subseq stopped the Simva20 (joint pain) & states she's "doing cholesterol w/ diet" ~  FLP 6/16 on diet alone showed TChol 250, TG 238, HDL 35, LDL 164; she was intol Simva20, therefore try Cres5...  Hx of MONONUCLEOSIS (ICD-075) - occured at age 36... no known residual.  POLYCYSTIC OVARIAN DISEASE (ICD-256.4) - followed by DrVHaygood who has her on BCPs/ hormones adjusted... ~  [facility]11:  [facility] & improved... ~  1/13:  Recurrent Migraines & DrHaygood has adjusted meds & added MIDRIN w/ improvement... ~  1/14 & 2/15:  She continues to follow up w/ DrHaygood for GYN... ~  7/15: she had [facility] by [facility]... ~  2/16: f/u OV w/ Gyn, DrHaygood> note reviewed, placed on Estrace  cream 2x/wk, she monitors VitD levels...  ORTHO issues evaluated & treated by Dr. Charlann Boxer, DO... 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 ~  2/15: she had f/u OV w/  DrZSmith> LBP & shoulder pain (s/p injection- improved), treated w/ PT & exercises...  Hx of FRACTURE, COCCYX (ICD-805.6)  VITAMIN D DEFICIENCY (ICD-268.9) - she tells me that her VitD level was low when checked by GYN & she's been placed on 50,000 u weekly... ~  She stopped the 50K suppplement & taking OTC supplement now, labs checked by Gyn she says...  MIGRAINES >> MIDRIN really helps & is back on this med... ~  She was evaluated & treated by DrFreeman's [facility] clinic on TOPAMAX 100mg /d & IMITREX 100mg  prn... ~  Off all migraine meds and doing well w/o recurrent HAs on a number ofr supplements...  ANXIETY/ DEPRESSION >> started on Zoloft 50mg /d by [facility] in 2012; she reports that this is helping & pt requests Zoloft 100mg - take 1/2 qd... ~  6/16:  She wants to wean off the Zoloft50- asked to decr to 1/2 tab, then Qod til gone...  ANEMIA >> she was anemic after the Hysterectomy 7/15 & placed on Iron, monitored by [facility] & subseq stopped- we don't have her labs... ~  Labs here 6/16 showed Hg= 15.3   Past Surgical History  Procedure Laterality Date  . Tonsillectomy and adenoidectomy      at age 27  . Laparoscopic assisted vaginal hysterectomy N/A 09/25/2013    Procedure: LAPAROSCOPIC ASSISTED VAGINAL HYSTERECTOMY;  Surgeon: Eldred Manges, MD;  Location: [facility];  Service: Gynecology;  Laterality: N/A;    Outpatient Encounter Prescriptions as of 08/15/2014  Medication Sig  . aspirin 81 MG tablet Take 81 mg by mouth daily.  Marland Kitchen CRANBERRY CONCENTRATE PO Take 1 tablet by mouth daily.  Marland Kitchen docusate sodium (COLACE) 100 MG capsule Take 1 capsule (100 mg total) by mouth 2 (two) times daily.  . fish oil-omega-3 fatty acids 1000 MG capsule Take 1 g by mouth daily.   . milk thistle 175 MG  tablet Take 175 mg by mouth daily.  . Misc Natural Products (FIBER 7 PO) Take 1 tablet by mouth daily.  . Multiple Vitamin (MULTIVITAMIN WITH MINERALS) TABS tablet Take 1 tablet by mouth daily.  . niacin 500 MG tablet Take 500 mg by mouth at bedtime.  . sertraline (ZOLOFT) 100 MG tablet Take 1/2 tablet by mouth daily  . vitamin C (ASCORBIC ACID) 500 MG tablet Take 500 mg by mouth daily.  . vitamin E 100 UNIT capsule Take 100 Units by mouth daily.  . [DISCONTINUED] Ferrous Sulfate 143 (45 FE) MG TBCR Take 1 tablet by mouth 2 (two) times daily.  . [DISCONTINUED] ibuprofen (ADVIL,MOTRIN) 600 MG tablet 1  po pc every 6 hours for 5 days then prn-pain  . [DISCONTINUED] ondansetron (ZOFRAN) 4 MG tablet Take 1 tablet (4 mg total) by mouth every 8 (eight) hours as needed for nausea or vomiting.  . [DISCONTINUED] oxyCODONE-acetaminophen (PERCOCET/ROXICET) 5-325 MG per tablet Take 1-2 tablets by mouth every 4 (four) hours as needed for severe pain (moderate to severe pain (when tolerating fluids)).  . [DISCONTINUED] promethazine (PHENERGAN) 12.5 MG tablet Take 1 tablet (12.5 mg total) by mouth every 6 (six) hours as needed for nausea or vomiting.  . [DISCONTINUED] simvastatin (ZOCOR) 40 MG tablet Take 1/2 tablet by mouth daily   No facility-administered encounter medications on file as of 08/15/2014.    No Known Allergies   Current Medications, Allergies, Past Medical History, Past Surgical History, Family History, and Social History were reviewed in [employer] record.    Review of Systems  The patient denies recurrent headaches.  The patient denies fever, chills, sweats, anorexia, fatigue, weakness, malaise, weight loss, sleep disorder, blurring, diplopia, eye irritation, eye discharge, vision loss, eye pain, photophobia, earache, ear discharge, tinnitus, decreased hearing, nosebleeds, chest pain, palpitations, syncope, dyspnea on exertion, orthopnea, PND, peripheral edema,  dyspnea at rest, excessive sputum, hemoptysis, wheezing, pleurisy, nausea, vomiting, diarrhea, constipation, change in bowel habits, abdominal pain, melena, hematochezia, jaundice, gas/bloating, indigestion/heartburn, dysphagia, odynophagia, dysuria, hematuria, urinary frequency, urinary hesitancy, nocturia, incontinence, back pain, joint pain, joint swelling, muscle cramps, muscle weakness, stiffness, arthritis, sciatica, restless legs, leg pain at night, leg pain with exertion, rash, itching, dryness, suspicious lesions, paralysis, paresthesias, seizures, tremors, vertigo, transient blindness, frequent falls, difficulty walking, depression, anxiety, memory loss, confusion, cold intolerance, heat intolerance, polydipsia, polyphagia, polyuria, unusual weight change, abnormal bruising, bleeding, enlarged lymph nodes, urticaria, allergic rash, hay fever, and recurrent infections.     Objective:   Physical Exam    WD, WN, 44 y/o WF in NAD... GENERAL:  Alert & oriented; pleasant & cooperative... HEENT:  [place]/AT, EOM-wnl, PERRLA, Fundi-benign, EACs-clear, TMs-wnl, NOSE-clear, THROAT-clear & wnl. NECK:  Supple w/ full ROM; no JVD; normal carotid impulses w/o bruits; no thyromegaly or nodules palpated; no lymphadenopathy. CHEST:  Clear to P & A; without wheezes/ rales/ or rhonchi. HEART:  Regular Rhythm; without murmurs/ rubs/ or gallops. ABDOMEN:  Soft & nontender; normal bowel sounds; no organomegaly or masses detected. EXT: without deformities or arthritic changes; no varicose veins/ venous insuffic/ or edema. NEURO:  CN's intact; motor testing normal; sensory testing normal; gait normal & balance OK. DERM:  No lesions noted; no rash etc...  RADIOLOGY DATA:  Reviewed in the EPIC EMR & discussed w/ the patient...   LABORATORY DATA:  Reviewed in the EPIC EMR & discussed w/ the patient...   Assessment:      CPX>>  Hyperlipidemia>  Chol looked good on Simva20 but she developed joint pain & stopped  the statin; on diet & exerc she lost 14# but f/u FLP on diet alone is not at goals w/ TChol250, LDL164, & REC to try Cres5...  Polycystic Ovarian dis>  Managed by [facility]- s/p [facility] 7/15 & off hormones on Estrace vag cream now...  Vit D Defic>  Vit D levels checked by [facility] per pt...  HEADACHES>  Improved w/ hormone adjustment & w/ MIDRIN for prn use... She saw DrFreeman in the past & off all his meds now...  Anxiety/ Depression>  Improved on Zoloft50, & she requests to wean off- ok...     Plan:     Patient's Medications  New Prescriptions                                                                CRESTOR 5mg - start one tab daily...   Previous Medications   ASPIRIN 81 MG TABLET    Take 81 mg by mouth daily.   CRANBERRY CONCENTRATE PO    Take 1 tablet by mouth daily.   DOCUSATE SODIUM (COLACE) 100 MG CAPSULE    Take 1 capsule (100 mg total) by mouth 2 (two) times daily.   FISH OIL-OMEGA-3 FATTY ACIDS 1000 MG CAPSULE    Take 1 g by mouth daily.    MILK THISTLE 175 MG TABLET  Take 175 mg by mouth daily.   MISC NATURAL PRODUCTS (FIBER 7 PO)    Take 1 tablet by mouth daily.   MULTIPLE VITAMIN (MULTIVITAMIN WITH MINERALS) TABS TABLET    Take 1 tablet by mouth daily.   NIACIN 500 MG TABLET    Take 500 mg by mouth at bedtime.   SERTRALINE (ZOLOFT) 100 MG TABLET    Take 1/2 tablet by mouth daily   VITAMIN C (ASCORBIC ACID) 500 MG TABLET    Take 500 mg by mouth daily.   VITAMIN E 100 UNIT CAPSULE    Take 100 Units by mouth daily.  Modified Medications   No medications on file  Discontinued Medications   FERROUS SULFATE 143 (45 FE) MG TBCR    Take 1 tablet by mouth 2 (two) times daily.   IBUPROFEN (ADVIL,MOTRIN) 600 MG TABLET    1  po pc every 6 hours for 5 days then prn-pain   ONDANSETRON (ZOFRAN) 4 MG TABLET    Take 1 tablet (4 mg total) by mouth every 8 (eight) hours as needed for nausea or vomiting.   OXYCODONE-ACETAMINOPHEN (PERCOCET/ROXICET) 5-325 MG PER TABLET    Take 1-2  tablets by mouth every 4 (four) hours as needed for severe pain (moderate to severe pain (when tolerating fluids)).   PROMETHAZINE (PHENERGAN) 12.5 MG TABLET    Take 1 tablet (12.5 mg total) by mouth every 6 (six) hours as needed for nausea or vomiting.   SIMVASTATIN (ZOCOR) 40 MG TABLET    Take 1/2 tablet by mouth daily

## 2014-08-15 NOTE — Patient Instructions (Signed)
Today we updated your med list in our EPIC system...    Continue your current medications the same...  Today we did your FASTING blood work off the simvastatin on diet alone... Congrats on the weight loss- keep up the good work...    We will contact you w/ the results when available...   Call for any questions or if we can be of service in any way...  Let's plan a follow up visit in 48yr, sooner if needed for problems.Marland KitchenMarland Kitchen

## 2014-08-20 ENCOUNTER — Telehealth: Payer: Self-pay | Admitting: Pulmonary Disease

## 2014-08-20 DIAGNOSIS — E782 Mixed hyperlipidemia: Secondary | ICD-10-CM

## 2014-08-20 MED ORDER — ROSUVASTATIN CALCIUM 5 MG PO TABS: 5.0000 mg | ORAL_TABLET | Freq: Every day | ORAL | Status: DC

## 2014-08-20 NOTE — Telephone Encounter (Signed)
Per SN, pt is not to take simvastatin due to joint pain. New order for crestor 5mg  po q day called into pt's pharmacy. Pt notified of the change. Nothing further is needed.

## 2014-08-20 NOTE — Telephone Encounter (Signed)
Spoke with pt regarding lipid results per Dr Lenna Gilford.  She states that she had come off of Simvastatin due to some joint pain but she states that Dr Lenna Gilford told her that Simvastatin was unlikely causing this symptom.  If Dr Lenna Gilford wants her to stay on Simvastatin instead of changing to Crestor she is willing to do this.  Whichever he decides will need to be sent to [facility] on [address].  Please advise.

## 2014-10-20 ENCOUNTER — Other Ambulatory Visit: Payer: Self-pay | Admitting: Pulmonary Disease

## 2014-10-21 ENCOUNTER — Telehealth: Payer: Self-pay | Admitting: Pulmonary Disease

## 2014-10-21 DIAGNOSIS — E782 Mixed hyperlipidemia: Secondary | ICD-10-CM

## 2014-10-21 NOTE — Telephone Encounter (Signed)
Patient says she has some Simvastatin that she had before and finished the Simvastatin.  Patient says that the Crestor is too expensive (over $200).  Patient says that she would rather go back to Simvastatin since it wasn't expensive, unless Dr. Lenna Gilford wants her to take a different medication.    Dr. Lenna Gilford, please advise.  Current Outpatient Prescriptions on File Prior to Visit  Medication Sig Dispense Refill  . aspirin 81 MG tablet Take 81 mg by mouth daily.    Marland Kitchen CRANBERRY CONCENTRATE PO Take 1 tablet by mouth daily.    Marland Kitchen docusate sodium (COLACE) 100 MG capsule Take 1 capsule (100 mg total) by mouth 2 (two) times daily. 100 capsule 0  . fish oil-omega-3 fatty acids 1000 MG capsule Take 1 g by mouth daily.     . milk thistle 175 MG tablet Take 175 mg by mouth daily.    . Misc Natural Products (FIBER 7 PO) Take 1 tablet by mouth daily.    . Multiple Vitamin (MULTIVITAMIN WITH MINERALS) TABS tablet Take 1 tablet by mouth daily.    . niacin 500 MG tablet Take 500 mg by mouth at bedtime.    . rosuvastatin (CRESTOR) 5 MG tablet Take 1 tablet (5 mg total) by mouth daily. 30 tablet 2  . sertraline (ZOLOFT) 100 MG tablet TAKE 1/2 TABLET BY MOUTH DAILY 30 tablet 3  . vitamin C (ASCORBIC ACID) 500 MG tablet Take 500 mg by mouth daily.    . vitamin E 100 UNIT capsule Take 100 Units by mouth daily.     No current facility-administered medications on file prior to visit.   No Known Allergies

## 2014-10-22 MED ORDER — ATORVASTATIN CALCIUM 10 MG PO TABS: 10.0000 mg | ORAL_TABLET | Freq: Every day | ORAL | Status: DC

## 2014-10-22 NOTE — Telephone Encounter (Signed)
Per SN Advise pt that crestor will be discontinued per her request since medication is too expensive Send order for atorvastatin 10mg  to pt's pharmacy with instructions to take 1 po QD Inform pt of medication change Pt will need FLP in 3-4 months to insure medication is working properly  Pt called and advised of the above Pt voiced understanding Order sent to pt's pharmacy  Nothing further is needed

## 2015-02-27 ENCOUNTER — Telehealth: Payer: Self-pay | Admitting: Pulmonary Disease

## 2015-02-27 MED ORDER — ATORVASTATIN CALCIUM 10 MG PO TABS: 10.0000 mg | ORAL_TABLET | Freq: Every day | ORAL | Status: DC

## 2015-02-27 NOTE — Telephone Encounter (Signed)
Called spoke with pt. Aware lipitor sent in for 90 day supply. Nothing further needed

## 2015-08-17 ENCOUNTER — Ambulatory Visit: Payer: BLUE CROSS/BLUE SHIELD | Admitting: Pulmonary Disease

## 2015-09-21 ENCOUNTER — Ambulatory Visit: Payer: BLUE CROSS/BLUE SHIELD | Admitting: Pulmonary Disease

## 2015-11-02 ENCOUNTER — Ambulatory Visit: Payer: BLUE CROSS/BLUE SHIELD | Admitting: Pulmonary Disease

## 2015-11-09 ENCOUNTER — Encounter: Payer: Self-pay | Admitting: Pulmonary Disease

## 2015-11-09 ENCOUNTER — Other Ambulatory Visit (INDEPENDENT_AMBULATORY_CARE_PROVIDER_SITE_OTHER): Payer: BLUE CROSS/BLUE SHIELD

## 2015-11-09 ENCOUNTER — Ambulatory Visit (INDEPENDENT_AMBULATORY_CARE_PROVIDER_SITE_OTHER): Payer: BLUE CROSS/BLUE SHIELD | Admitting: Pulmonary Disease

## 2015-11-09 VITALS — BP 120/76 | HR 81 | Ht 61.0 in | Wt 173.0 lb

## 2015-11-09 DIAGNOSIS — E663 Overweight: Secondary | ICD-10-CM

## 2015-11-09 DIAGNOSIS — Z0000 Encounter for general adult medical examination without abnormal findings: Secondary | ICD-10-CM

## 2015-11-09 DIAGNOSIS — R7989 Other specified abnormal findings of blood chemistry: Secondary | ICD-10-CM | POA: Diagnosis not present

## 2015-11-09 DIAGNOSIS — E782 Mixed hyperlipidemia: Secondary | ICD-10-CM

## 2015-11-09 DIAGNOSIS — E559 Vitamin D deficiency, unspecified: Secondary | ICD-10-CM

## 2015-11-09 LAB — CBC WITH DIFFERENTIAL/PLATELET
BASOS ABS: 0.1 10*3/uL (ref 0.0–0.1)
Basophils Relative: 0.7 % (ref 0.0–3.0)
EOS ABS: 0.2 10*3/uL (ref 0.0–0.7)
Eosinophils Relative: 1.8 % (ref 0.0–5.0)
HEMATOCRIT: 44.2 % (ref 36.0–46.0)
HEMOGLOBIN: 15.2 g/dL — AB (ref 12.0–15.0)
LYMPHS PCT: 33.7 % (ref 12.0–46.0)
Lymphs Abs: 4 10*3/uL (ref 0.7–4.0)
MCHC: 34.3 g/dL (ref 30.0–36.0)
MCV: 88.8 fl (ref 78.0–100.0)
MONO ABS: 0.6 10*3/uL (ref 0.1–1.0)
Monocytes Relative: 5.1 % (ref 3.0–12.0)
Neutro Abs: 6.9 10*3/uL (ref 1.4–7.7)
Neutrophils Relative %: 58.7 % (ref 43.0–77.0)
Platelets: 284 10*3/uL (ref 150.0–400.0)
RBC: 4.98 Mil/uL (ref 3.87–5.11)
RDW: 12.4 % (ref 11.5–15.5)
WBC: 11.8 10*3/uL — AB (ref 4.0–10.5)

## 2015-11-09 LAB — LDL CHOLESTEROL, DIRECT: LDL DIRECT: 88 mg/dL

## 2015-11-09 LAB — COMPREHENSIVE METABOLIC PANEL
ALBUMIN: 4.5 g/dL (ref 3.5–5.2)
ALT: 19 U/L (ref 0–35)
AST: 13 U/L (ref 0–37)
Alkaline Phosphatase: 51 U/L (ref 39–117)
BUN: 11 mg/dL (ref 6–23)
CHLORIDE: 103 meq/L (ref 96–112)
CO2: 26 meq/L (ref 19–32)
CREATININE: 0.59 mg/dL (ref 0.40–1.20)
Calcium: 9.2 mg/dL (ref 8.4–10.5)
GFR: 115.82 mL/min (ref >60.00)
GLUCOSE: 91 mg/dL (ref 70–99)
Potassium: 4.2 mEq/L (ref 3.5–5.1)
SODIUM: 137 meq/L (ref 135–145)
Total Bilirubin: 0.4 mg/dL (ref 0.2–1.2)
Total Protein: 7.5 g/dL (ref 6.0–8.3)

## 2015-11-09 LAB — LIPID PANEL
CHOL/HDL RATIO: 4
Cholesterol: 158 mg/dL (ref 0–200)
HDL: 36.8 mg/dL — ABNORMAL LOW (ref >39.00)
NONHDL: 121.54
Triglycerides: 282 mg/dL — ABNORMAL HIGH (ref 0.0–149.0)
VLDL: 56.4 mg/dL — ABNORMAL HIGH (ref 0.0–40.0)

## 2015-11-09 LAB — TSH: TSH: 0.96 u[IU]/mL (ref 0.35–4.50)

## 2015-11-09 MED ORDER — ATORVASTATIN CALCIUM 10 MG PO TABS: 10.0000 mg | ORAL_TABLET | Freq: Every day | ORAL | 3 refills | Status: DC

## 2015-11-09 MED ORDER — SERTRALINE HCL 100 MG PO TABS: 50.0000 mg | ORAL_TABLET | Freq: Every day | ORAL | 1 refills | Status: DC

## 2015-11-09 NOTE — Patient Instructions (Signed)
Today we updated your med list in our EPIC system...    Continue your current medications the same...  We discussed a 3 month trial of DIET & EXERCISE to see if it improves your energy 7 stamina...  Today we checked your FASTING blood work...    We will contact you w/ the results when available...   Call for any questions...   Let's plan a follow up visit in 28yr, sooner if needed for any problems.Marland KitchenMarland Kitchen

## 2015-11-09 NOTE — Progress Notes (Signed)
Subjective:     Patient ID: Kristen [name], female   DOB: Oct 25, 1970, 45 y.o.   MRN: [id number]  HPI 45 y/o WF here for a yearly follow up and CPX... she has hypercholestrolemia treated w/ Simva20... ~  SEE PREV EPIC NOTES FOR OLDER DATA >>   ~  April 13, 2012:  Yearly ROV & CPX> Kristen [name] reports "a few minor issues" over the past yr- mostly revolving around the orthopedic area w/ bursitis in hip, shoulder pain w/ "part dislocation" from sleeping wrong, knee pain, plantar fasciitis, fall w/ bruised rib cage; she has been attended by ZachSmithDO (rec by her mother since he does natural cures) who gave her a shot in her hip, adjusted her shoulder, etc;  She also notes decr energy, not resting well, ?FM...    She has gained 6# up to 175# and FLP is worse w/ incr TG to 413; needs better diet, get wt down & start Fenofibrate160/d...    She continues f/u w/ GYN DrHaygood due to hx PCOS as noted below; remains on VitD 50K weekly w/ lab by GYN she says...    Hx migaine HAs on Topamax, Imitrex; prev on Midrin as needed but she tells me she's stopped all pain meds; she used to see DrFreeman but is doing well now on stable regimen...     She continues to do well on Zoloft 50mg /d... We reviewed prob list, meds, xrays and labs> see below for updates >> she had the 2013 Flu vaccine 11/13; last TDAP 4/11; refills written...  LABS 1/14:  FLP- TChol 151, TG 413, HDL 33, LDL 70 on Simva20;  Chems- wnl;  CBC- wnl;  TSH=1.30   ~  April 15, 2013:  Yearly ROV & CPX> Kristen [name] reports a good yr- but she has had some bursitis in right shoulder, eval by DrZSmith w/ shot & exercises that have helped;  Sh had Abd pain 9/14- went to the ER to r/o GB, had CT Abd showing hep steatosis, 78mm calculous in right kidney, sl dilated sm bowel, uterine fibroids; treated w/ antibiotics and pain meds=> resolved; GYN is dealing w/ those issues... We reviewed the following medical problems during today's office visit >>     Hyperlipid> on  Simva40-1/2/d & diet; she refused Feno160 due to $$; FLP 2/15 showed TChol 170, TG 178, HDL 48, LDL 87... Needs better low fat diet & wt reduction...     Polycystic Ovarian Dis> followed by [facility]- DrHaygood w/ heavy menses, on BCPs, we do not have notes from them...    ORTHO> followed for sports med by DrZSmith, bursitis in right shoulder w/ shot & exercises- improved...     VitD defic> on 50K weekly per Gyn but she stopped & using OTC supplement... VitD level done by Gyn she says...    Hx Migraines> on Mobic15 & Imitrex100 prn; off prev Topamax & Vicodin;     Hx anxiety/depression> on Zoloft25 & she is doing satis on this dose she says... We reviewed prob list, meds, xrays and labs> see below for updates >>   CXR 2/15 showed norm heart size, clear lungs, NAD.Marland KitchenMarland Kitchen  EKG 2/15 showed NSR, rate80, WNL.Marland KitchenMarland Kitchen  LABS 2/15:  FLP- ok x TG=178;  Chems- wnl;  CBC- wnl;  NOTE- TSH & VitD were done by her Gyn she says & we don't have those records...   ~  August 15, 2014:  77mo ROV & Kristen [name] is here for f/u CPX> she had a hysterectomy 7/15 by [facility] for  fibroids and menorrhagia; she also has polycystic ovarian dis & anemia w/ Hg=8.2 post op; she was on Fe & VitD Rx- monitored by Gyn & pt tells me she is better & off these supplements... We reviewed the following medical problems during today's office visit >>     Hyperlipid> off Simva20 now (joint pain) & on diet w/ 14# wt loss; FLP 6/16 showed TChol 250, TG 238, HDL 35, LDL 164; Rec to start Cres5 & recheck labs 28mo...    Polycystic Ovarian Dis> followed by [facility]- DrHaygood w/ prev heavy menses & fibroids=> s/p [facility] 7/15 and now off hormones on Estrace cream...    ORTHO> followed for sports med by DrZSmith, bursitis in right shoulder w/ shot & exercises- improved; has some LBP- on PT & exercises...     VitD defic> prev on 50K weekly per Gyn but off now & VitD level done by Gyn & ok per pt report...    Hx Migraines> prev on Mobic15 & Imitrex100 prn; off prev Topamax &  Vicodin; no HAs reported and she's taking a lot of supplements...    Hx anxiety/depression> on Zoloft50 & she is doing satis on this dose, she wants to wean off & suggested to cut to 1/2 then Qod til gone... We reviewed prob list, meds, xrays and labs> see below for updates >>   LABS 6/16:  FLP- not at goals on diet alone;  Chems- wnl;  CBC- wnl;  TSH=1.83  ~  November 09, 2015:  60mo ROV & CPX>  Kristen [name] reports a good yr overall, just c/o decr energy "like I could sleep all day" & notes it's worse in the hot humid weather; we discussed checking her routine labs for any abn that might help explain her fatigue, otherw needs DIET & wt reduction, exercise to incr stamina...  We reviewed the following medical problems during today's office visit >>     Mixed Hyperlipid> on Atorva10 (intol Simva w/ joint pain & Cres too $$); needs better diet; FLP 8/17 showed TChol 158, TG 282, HDL 37, LDL 88-- needs better low fat diet & wt reduction...    Polycystic Ovarian Dis> followed by [facility]- DrHaygood w/ prev heavy menses & fibroids=> s/p [facility] 7/15 and now off hormones on Estrace cream...    ORTHO> followed for sports med by DrZSmith, bursitis in right shoulder w/ shot & exercises- improved; has some LBP- on PT & exercises...     VitD defic> prev on 50K weekly per Gyn but off now & VitD level done by Gyn & ok per pt report...    Hx Migraines> prev on Mobic15 & Imitrex100 prn; off prev Topamax & Vicodin; no HAs reported and she's taking a lot of supplements ("natural products")...    Hx anxiety/depression> on Zoloft50 (1/2 of the 100) & she is doing satis on this dose & med refilled... EXAM shows Afeb, VSS, O2sat=98% on RA;  Wt=173# (up 13# this last yr) & BMI=33;  HEENT- neg, mallampati2;  Chest- clear w/o w/r/r;  Heart- RR w/o m/r/g;  Abd- soft nontender neg;  Wxt- neg w/o c/c/e;  Neuro- intact...   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 ~  [place] 9/08 showed TChol 146, TG 143, HDL 48, LDL 70... Chol is good on Simva20. ~  [place] 12/09 showed TChol 159, TG 165, HDL 34, LDL 92... reminder- low fat diet... ~  [place] 1/11 showed TChol 182, TG 294, HDL 48, LDL 75... OK on Simva20, may need Fibrate. ~  FLP 1/12 showed TChol 141, TG 216, HDL 46,LDL 70... contin same w/ low fat diet. ~  FLP 1/13 on Simva20 showed TChol 167, TG 194, HDL 45, LDL 83... rec same med, better low fat diet... ~  [place] 1/14 on Simva20 showed TChol 151, TG 413, HDL 33, LDL 70... rec to add FENOFIBRATE160/d=> she stopped due to $$ ~  FLP 2/15 on Simva20 showed TChol 170, TG 178, HDL 48, LDL 87 ~  NOTE> she subseq stopped the Simva20 (joint pain) & states she's "doing cholesterol w/ diet" ~  FLP 6/16 on diet alone showed TChol 250, TG 238, HDL 35, LDL 164; she was intol Simva20, therefore try Cres5...  Hx of MONONUCLEOSIS (ICD-075) - occured at age 9... no known residual.  POLYCYSTIC OVARIAN DISEASE (ICD-256.4) - followed by DrVHaygood who has her on BCPs/ hormones adjusted... ~  [facility]  [facility] & improved... ~  1/13:  Recurrent Migraines & DrHaygood has adjusted meds & added MIDRIN w/ improvement... ~  1/14 & 2/15:  She continues to follow up w/ DrHaygood for GYN... ~  7/15: she had [facility]... ~  2/16: f/u OV w/ Gyn, DrHaygood> note reviewed, placed on Estrace cream 2x/wk, she monitors VitD levels...  ORTHO issues evaluated & treated by Dr. Charlann Boxer, DO... 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 ~  2/15:  she had f/u OV w/ DrZSmith> LBP & shoulder pain (s/p injection- improved), treated w/ PT & exercises...  Hx of FRACTURE, COCCYX (ICD-805.6)  VITAMIN D DEFICIENCY (ICD-268.9) - she tells me that her VitD level was low when checked by GYN & she's been placed on 50,000 u weekly... ~  She stopped the 50K suppplement & taking OTC supplement now, labs checked by Gyn she says...  MIGRAINES >> MIDRIN really helps & is back on this med... ~  She was evaluated & treated by DrFreeman's HA clinic on TOPAMAX 100mg /d & IMITREX 100mg  prn... ~  Off all migraine meds and doing well w/o recurrent HAs on a number ofr supplements...  ANXIETY/ DEPRESSION >> started on Zoloft 50mg /d by [facility] in 2012; she reports that this is helping & pt requests Zoloft 100mg - take 1/2 qd... ~  6/16:  She wants to wean off the Zoloft50- asked to decr to 1/2 tab, then Qod til gone...  ANEMIA >> she was anemic after the Hysterectomy 7/15 & placed on Iron, monitored by [facility] & subseq stopped- we don't have her labs... ~  Labs here 6/16 showed Hg= 15.3   Past Surgical History:  Procedure Laterality Date  . LAPAROSCOPIC ASSISTED VAGINAL HYSTERECTOMY N/A 09/25/2013   Procedure: LAPAROSCOPIC ASSISTED VAGINAL HYSTERECTOMY;  Surgeon: Eldred Manges, MD;  Location: [facility];  Service: Gynecology;  Laterality: N/A;  . TONSILLECTOMY AND ADENOIDECTOMY     at age 74    Outpatient Encounter Prescriptions as of 11/09/2015  Medication Sig Dispense Refill  . aspirin 81  MG tablet Take 81 mg by mouth daily.    Marland Kitchen atorvastatin (LIPITOR) 10 MG tablet Take 1 tablet (10 mg total) by mouth daily. 90 tablet 3  . CRANBERRY CONCENTRATE PO Take 1 tablet by mouth daily.    Marland Kitchen docusate sodium (COLACE) 100 MG capsule Take 1 capsule (100 mg total) by mouth 2 (two) times daily. 100 capsule 0  . fish oil-omega-3 fatty acids 1000 MG capsule Take 1 g by mouth daily.     . milk thistle 175 MG tablet Take 175 mg by mouth daily.    . Misc Natural Products  (FIBER 7 PO) Take 1 tablet by mouth daily.    . Multiple Vitamin (MULTIVITAMIN WITH MINERALS) TABS tablet Take 1 tablet by mouth daily.    . niacin 500 MG tablet Take 500 mg by mouth at bedtime.    . sertraline (ZOLOFT) 100 MG tablet Take 0.5 tablets (50 mg total) by mouth daily. 90 tablet 1  . vitamin C (ASCORBIC ACID) 500 MG tablet Take 500 mg by mouth daily.    . vitamin E 100 UNIT capsule Take 100 Units by mouth daily.    . [DISCONTINUED] atorvastatin (LIPITOR) 10 MG tablet Take 1 tablet (10 mg total) by mouth daily. 90 tablet 1  . [DISCONTINUED] sertraline (ZOLOFT) 100 MG tablet TAKE 1/2 TABLET BY MOUTH DAILY 30 tablet 3   No facility-administered encounter medications on file as of 11/09/2015.     No Known Allergies   Current Medications, Allergies, Past Medical History, Past Surgical History, Family History, and Social History were reviewed in [employer] record.    Review of Systems        The patient denies recurrent headaches.  The patient denies fever, chills, sweats, anorexia, fatigue, weakness, malaise, weight loss, sleep disorder, blurring, diplopia, eye irritation, eye discharge, vision loss, eye pain, photophobia, earache, ear discharge, tinnitus, decreased hearing, nosebleeds, chest pain, palpitations, syncope, dyspnea on exertion, orthopnea, PND, peripheral edema, dyspnea at rest, excessive sputum, hemoptysis, wheezing, pleurisy, nausea, vomiting, diarrhea, constipation, change in bowel habits, abdominal pain, melena, hematochezia, jaundice, gas/bloating, indigestion/heartburn, dysphagia, odynophagia, dysuria, hematuria, urinary frequency, urinary hesitancy, nocturia, incontinence, back pain, joint pain, joint swelling, muscle cramps, muscle weakness, stiffness, arthritis, sciatica, restless legs, leg pain at night, leg pain with exertion, rash, itching, dryness, suspicious lesions, paralysis, paresthesias, seizures, tremors, vertigo, transient blindness,  frequent falls, difficulty walking, depression, anxiety, memory loss, confusion, cold intolerance, heat intolerance, polydipsia, polyphagia, polyuria, unusual weight change, abnormal bruising, bleeding, enlarged lymph nodes, urticaria, allergic rash, hay fever, and recurrent infections.     Objective:   Physical Exam    WD, WN, 45 y/o WF in NAD... GENERAL:  Alert & oriented; pleasant & cooperative... HEENT:  [place]/AT, EOM-wnl, PERRLA, Fundi-benign, EACs-clear, TMs-wnl, NOSE-clear, THROAT-clear & wnl. NECK:  Supple w/ full ROM; no JVD; normal carotid impulses w/o bruits; no thyromegaly or nodules palpated; no lymphadenopathy. CHEST:  Clear to P & A; without wheezes/ rales/ or rhonchi. HEART:  Regular Rhythm; without murmurs/ rubs/ or gallops. ABDOMEN:  Soft & nontender; normal bowel sounds; no organomegaly or masses detected. EXT: without deformities or arthritic changes; no varicose veins/ venous insuffic/ or edema. NEURO:  CN's intact; motor testing normal; sensory testing normal; gait normal & balance OK. DERM:  No lesions noted; no rash etc...  RADIOLOGY DATA:  Reviewed in the EPIC EMR & discussed w/ the patient...   LABORATORY DATA:  Reviewed in the EPIC EMR & discussed w/ the patient.Marland KitchenMarland Kitchen  Assessment:      CPX>>  11/09/15:  Kristen [name] is stable- we discussed the need for incr exercise, low carb/ low fat diet & wt reduction; labs are all ok; continue f/u w/ GYN- DrHaygood...  Hyperlipidemia>  Chol looked good on Simva20 but she developed joint pain & stopped the statin; on diet & exerc she lost 14# but f/u FLP on diet alone is not at goals w/ TChol250, LDL164, & REC to try Cres5...  Polycystic Ovarian dis>  Managed by [facility]- s/p [facility] 7/15 & off hormones on Estrace vag cream now...  Vit D Defic>  Vit D levels checked by [facility] per pt...  HEADACHES>  Improved w/ hormone adjustment & w/ MIDRIN for prn use... She saw DrFreeman in the past & off all his meds now...  Anxiety/  Depression>  Improved on Zoloft50, & she requests to wean off- ok...     Plan:     Patient's Medications  New Prescriptions                                                                CRESTOR 5mg - start one tab daily...   Previous Medications   ASPIRIN 81 MG TABLET    Take 81 mg by mouth daily.   CRANBERRY CONCENTRATE PO    Take 1 tablet by mouth daily.   DOCUSATE SODIUM (COLACE) 100 MG CAPSULE    Take 1 capsule (100 mg total) by mouth 2 (two) times daily.   FISH OIL-OMEGA-3 FATTY ACIDS 1000 MG CAPSULE    Take 1 g by mouth daily.    MILK THISTLE 175 MG TABLET    Take 175 mg by mouth daily.   MISC NATURAL PRODUCTS (FIBER 7 PO)    Take 1 tablet by mouth daily.   MULTIPLE VITAMIN (MULTIVITAMIN WITH MINERALS) TABS TABLET    Take 1 tablet by mouth daily.   NIACIN 500 MG TABLET    Take 500 mg by mouth at bedtime.   SERTRALINE (ZOLOFT) 100 MG TABLET    Take 1/2 tablet by mouth daily   VITAMIN C (ASCORBIC ACID) 500 MG TABLET    Take 500 mg by mouth daily.   VITAMIN E 100 UNIT CAPSULE    Take 100 Units by mouth daily.  Modified Medications   No medications on file  Discontinued Medications   FERROUS SULFATE 143 (45 FE) MG TBCR    Take 1 tablet by mouth 2 (two) times daily.   IBUPROFEN (ADVIL,MOTRIN) 600 MG TABLET    1  po pc every 6 hours for 5 days then prn-pain   ONDANSETRON (ZOFRAN) 4 MG TABLET    Take 1 tablet (4 mg total) by mouth every 8 (eight) hours as needed for nausea or vomiting.   OXYCODONE-ACETAMINOPHEN (PERCOCET/ROXICET) 5-325 MG PER TABLET    Take 1-2 tablets by mouth every 4 (four) hours as needed for severe pain (moderate to severe pain (when tolerating fluids)).   PROMETHAZINE (PHENERGAN) 12.5 MG TABLET    Take 1 tablet (12.5 mg total) by mouth every 6 (six) hours as needed for nausea or vomiting.   SIMVASTATIN (ZOCOR) 40 MG TABLET    Take 1/2 tablet by mouth daily

## 2015-11-18 ENCOUNTER — Telehealth: Payer: Self-pay | Admitting: Pulmonary Disease

## 2015-11-18 NOTE — Telephone Encounter (Signed)
Notes Recorded by Elie Confer, CMA on 11/18/2015 at 9:59 AM EDT lmomtcb x 1 for pt ------  Notes Recorded by Noralee Space, MD on 11/11/2015 at 4:42 PM EDT Please notify patient>  FLP shows Chol OK on Lip10, but TG still elev (TG=282, goal <150); Atorva doing a good job on chol but it will take a better LOW FAT diet & WT LOSS to improve the TG... Chems- wnl; CBC is OK; Thyroid- wnl... REC- get on track w/ diet & exercise...   Pt is aware of results and will work on diet and exercise. Nothing more needed at this time.

## 2016-02-15 ENCOUNTER — Ambulatory Visit: Payer: BLUE CROSS/BLUE SHIELD | Admitting: Adult Health

## 2016-04-12 ENCOUNTER — Other Ambulatory Visit: Payer: Self-pay | Admitting: Pulmonary Disease

## 2016-08-23 ENCOUNTER — Telehealth: Payer: Self-pay | Admitting: Pulmonary Disease

## 2016-08-23 NOTE — Telephone Encounter (Signed)
Spoke with pt who states she no longer has insurance and she wanted to know who would she speak to about estimated cost for the [employer], and lab work. Informed the pt that we would reach out to someone in our billing department to see if they have any information to whom the pt could speak with.   Kathlee Nations do you have any phone numbers that would help the pt

## 2016-08-25 ENCOUNTER — Other Ambulatory Visit: Payer: Self-pay | Admitting: Obstetrics and Gynecology

## 2016-08-25 DIAGNOSIS — Z1231 Encounter for screening mammogram for malignant neoplasm of breast: Secondary | ICD-10-CM

## 2016-08-25 NOTE — Telephone Encounter (Signed)
Spoke with Kathlee Nations. She states that she will reach out to the pt and speak to her about this. Message will be closed.

## 2016-08-25 NOTE — Telephone Encounter (Signed)
Called patient, Gave her the following estimates based on her previous visits with Dr. Lenna Gilford,  [phone] for the office visit, $95 for CXR, $78 for EKG, $170 for lab work.  Also let her know that for patients with no insurance, there is an automatic 55% discount.  She stated this answered her questions and her and her husband will keep their appointments.

## 2016-09-12 ENCOUNTER — Ambulatory Visit
Admission: RE | Admit: 2016-09-12 | Discharge: 2016-09-12 | Disposition: A | Discharge status: Home / Self Care | Payer: BLUE CROSS/BLUE SHIELD | Source: Ambulatory Visit | Attending: Obstetrics and Gynecology | Admitting: Obstetrics and Gynecology

## 2016-09-12 DIAGNOSIS — Z1231 Encounter for screening mammogram for malignant neoplasm of breast: Secondary | ICD-10-CM

## 2016-11-07 ENCOUNTER — Other Ambulatory Visit (INDEPENDENT_AMBULATORY_CARE_PROVIDER_SITE_OTHER): Payer: Self-pay

## 2016-11-07 ENCOUNTER — Ambulatory Visit: Payer: Self-pay | Admitting: Pulmonary Disease

## 2016-11-07 ENCOUNTER — Encounter: Payer: Self-pay | Admitting: Pulmonary Disease

## 2016-11-07 ENCOUNTER — Ambulatory Visit (INDEPENDENT_AMBULATORY_CARE_PROVIDER_SITE_OTHER)
Admission: RE | Admit: 2016-11-07 | Discharge: 2016-11-07 | Disposition: A | Discharge status: Home / Self Care | Payer: Self-pay | Source: Ambulatory Visit | Attending: Pulmonary Disease | Admitting: Pulmonary Disease

## 2016-11-07 VITALS — BP 120/84 | HR 71 | Temp 97.3°F | Ht 61.0 in | Wt 158.4 lb

## 2016-11-07 DIAGNOSIS — Z0000 Encounter for general adult medical examination without abnormal findings: Secondary | ICD-10-CM

## 2016-11-07 DIAGNOSIS — E782 Mixed hyperlipidemia: Secondary | ICD-10-CM

## 2016-11-07 DIAGNOSIS — E282 Polycystic ovarian syndrome: Secondary | ICD-10-CM

## 2016-11-07 DIAGNOSIS — E559 Vitamin D deficiency, unspecified: Secondary | ICD-10-CM

## 2016-11-07 DIAGNOSIS — F341 Dysthymic disorder: Secondary | ICD-10-CM

## 2016-11-07 LAB — CBC WITH DIFFERENTIAL/PLATELET
BASOS ABS: 0.1 10*3/uL (ref 0.0–0.1)
Basophils Relative: 0.8 % (ref 0.0–3.0)
EOS PCT: 2.8 % (ref 0.0–5.0)
Eosinophils Absolute: 0.3 10*3/uL (ref 0.0–0.7)
HEMATOCRIT: 44.3 % (ref 36.0–46.0)
Hemoglobin: 15 g/dL (ref 12.0–15.0)
Lymphocytes Relative: 32.9 % (ref 12.0–46.0)
Lymphs Abs: 3.3 10*3/uL (ref 0.7–4.0)
MCHC: 33.8 g/dL (ref 30.0–36.0)
MCV: 92.7 fl (ref 78.0–100.0)
MONOS PCT: 5.7 % (ref 3.0–12.0)
Monocytes Absolute: 0.6 10*3/uL (ref 0.1–1.0)
NEUTROS ABS: 5.7 10*3/uL (ref 1.4–7.7)
Neutrophils Relative %: 57.8 % (ref 43.0–77.0)
Platelets: 246 10*3/uL (ref 150.0–400.0)
RBC: 4.78 Mil/uL (ref 3.87–5.11)
RDW: 13 % (ref 11.5–15.5)
WBC: 9.9 10*3/uL (ref 4.0–10.5)

## 2016-11-07 LAB — COMPREHENSIVE METABOLIC PANEL
ALBUMIN: 4.5 g/dL (ref 3.5–5.2)
ALK PHOS: 50 U/L (ref 39–117)
ALT: 14 U/L (ref 0–35)
AST: 14 U/L (ref 0–37)
BILIRUBIN TOTAL: 0.8 mg/dL (ref 0.2–1.2)
BUN: 10 mg/dL (ref 6–23)
CO2: 29 mEq/L (ref 19–32)
Calcium: 9.7 mg/dL (ref 8.4–10.5)
Chloride: 102 mEq/L (ref 96–112)
Creatinine, Ser: 0.59 mg/dL (ref 0.40–1.20)
GFR: 115.34 mL/min (ref >60.00)
GLUCOSE: 93 mg/dL (ref 70–99)
Potassium: 4.5 mEq/L (ref 3.5–5.1)
SODIUM: 137 meq/L (ref 135–145)
TOTAL PROTEIN: 7.4 g/dL (ref 6.0–8.3)

## 2016-11-07 LAB — URINALYSIS
Bilirubin Urine: NEGATIVE
Hgb urine dipstick: NEGATIVE
Ketones, ur: NEGATIVE
LEUKOCYTES UA: NEGATIVE
NITRITE: NEGATIVE
PH: 7 (ref 5.0–8.0)
Total Protein, Urine: NEGATIVE
Urine Glucose: NEGATIVE
Urobilinogen, UA: 0.2 (ref 0.0–1.0)

## 2016-11-07 LAB — LIPID PANEL
CHOL/HDL RATIO: 4
Cholesterol: 183 mg/dL (ref 0–200)
HDL: 41.6 mg/dL (ref >39.00)
LDL Cholesterol: 106 mg/dL — ABNORMAL HIGH (ref 0–99)
NONHDL: 141.56
Triglycerides: 177 mg/dL — ABNORMAL HIGH (ref 0.0–149.0)
VLDL: 35.4 mg/dL (ref 0.0–40.0)

## 2016-11-07 LAB — VITAMIN D 25 HYDROXY (VIT D DEFICIENCY, FRACTURES): VITD: 17.66 ng/mL — ABNORMAL LOW (ref 30.00–100.00)

## 2016-11-07 LAB — TSH: TSH: 1.23 u[IU]/mL (ref 0.35–4.50)

## 2016-11-07 MED ORDER — SERTRALINE HCL 100 MG PO TABS: 50.0000 mg | ORAL_TABLET | Freq: Every day | ORAL | 4 refills | Status: DC

## 2016-11-07 MED ORDER — ALPRAZOLAM 0.5 MG PO TABS: ORAL_TABLET | ORAL | 3 refills | Status: DC

## 2016-11-07 MED ORDER — ATORVASTATIN CALCIUM 20 MG PO TABS: ORAL_TABLET | ORAL | 4 refills | Status: DC

## 2016-11-07 NOTE — Patient Instructions (Signed)
Today we updated your med list in our EPIC system...    Continue your current medications the same...    We refilled your meds per request...  Call [facility] in [place] at [phone] or [email or web address] to check their prices...  We added ALPRAZOLAM 0.5mg  to try 1/2 to 1 tab up to twice daily as needed for anxiety or panic sensations...  Today we did a follow up CXR/ EKG/ & FASTING blood work...    We will contact you w/ the results when available...   Keep up the great work w/ diet & wt reduction!  Call for any questions...  Let's plan a follow up visit in 53yr, sooner if needed for any problems.Marland KitchenMarland Kitchen

## 2016-11-07 NOTE — Progress Notes (Signed)
Subjective:     Patient ID: Kristen [name], female   DOB: March 11, 1971, 46 y.o.   MRN: [id number]  HPI 46 y/o WF here for a yearly follow up and CPX... she has hypercholestrolemia treated w/ Simva20... ~  SEE PREV EPIC NOTES FOR OLDER DATA >>   ~  April 13, 2012:  Yearly ROV & CPX> Kristen [name] reports "a few minor issues" over the past yr- mostly revolving around the orthopedic area w/ bursitis in hip, shoulder pain w/ "part dislocation" from sleeping wrong, knee pain, plantar fasciitis, fall w/ bruised rib cage; she has been attended by ZachSmithDO (rec by her mother since he does natural cures) who gave her a shot in her hip, adjusted her shoulder, etc;  She also notes decr energy, not resting well, ?FM...    She has gained 6# up to 175# and FLP is worse w/ incr TG to 413; needs better diet, get wt down & start Fenofibrate160/d...    She continues f/u w/ GYN DrHaygood due to hx PCOS as noted below; remains on VitD 50K weekly w/ lab by GYN she says...    Hx migaine HAs on Topamax, Imitrex; prev on Midrin as needed but she tells me she's stopped all pain meds; she used to see DrFreeman but is doing well now on stable regimen...     She continues to do well on Zoloft 50mg /d... We reviewed prob list, meds, xrays and labs> see below for updates >> she had the 2013 Flu vaccine 11/13; last TDAP 4/11; refills written...  LABS 1/14:  FLP- TChol 151, TG 413, HDL 33, LDL 70 on Simva20;  Chems- wnl;  CBC- wnl;  TSH=1.30   ~  April 15, 2013:  Yearly ROV & CPX> Kristen [name] reports a good yr- but she has had some bursitis in right shoulder, eval by DrZSmith w/ shot & exercises that have helped;  Sh had Abd pain 9/14- went to the ER to r/o GB, had CT Abd showing hep steatosis, 52mm calculous in right kidney, sl dilated sm bowel, uterine fibroids; treated w/ antibiotics and pain meds=> resolved; GYN is dealing w/ those issues... We reviewed the following medical problems during today's office visit >>     Hyperlipid> on  Simva40-1/2/d & diet; she refused Feno160 due to $$; FLP 2/15 showed TChol 170, TG 178, HDL 48, LDL 87... Needs better low fat diet & wt reduction...     Polycystic Ovarian Dis> followed by Concha [place]- DrHaygood w/ heavy menses, on BCPs, we do not have notes from them...    ORTHO> followed for sports med by DrZSmith, bursitis in right shoulder w/ shot & exercises- improved...     VitD defic> on 50K weekly per Gyn but she stopped & using OTC supplement... VitD level done by Gyn she says...    Hx Migraines> on Mobic15 & Imitrex100 prn; off prev Topamax & Vicodin;     Hx anxiety/depression> on Zoloft25 & she is doing satis on this dose she says... We reviewed prob list, meds, xrays and labs> see below for updates >>   CXR 2/15 showed norm heart size, clear lungs, NAD.Marland KitchenMarland Kitchen  EKG 2/15 showed NSR, rate80, WNL.Marland KitchenMarland Kitchen  LABS 2/15:  FLP- ok x TG=178;  Chems- wnl;  CBC- wnl;  NOTE- TSH & VitD were done by her Gyn she says & we don't have those records...   ~  August 15, 2014:  59mo ROV & Kristen [name] is here for f/u CPX> she had a hysterectomy 7/15 by [facility] for  fibroids and menorrhagia; she also has polycystic ovarian dis & anemia w/ Hg=8.2 post op; she was on Fe & VitD Rx- monitored by Gyn & pt tells me she is better & off these supplements... We reviewed the following medical problems during today's office visit >>     Hyperlipid> off Simva20 now (joint pain) & on diet w/ 14# wt loss; FLP 6/16 showed TChol 250, TG 238, HDL 35, LDL 164; Rec to start Cres5 & recheck labs 32mo...    Polycystic Ovarian Dis> followed by Concha [place]- DrHaygood w/ prev heavy menses & fibroids=> s/p [facility] 7/15 and now off hormones on Estrace cream...    ORTHO> followed for sports med by DrZSmith, bursitis in right shoulder w/ shot & exercises- improved; has some LBP- on PT & exercises...     VitD defic> prev on 50K weekly per Gyn but off now & VitD level done by Gyn & ok per pt report...    Hx Migraines> prev on Mobic15 & Imitrex100 prn; off prev Topamax &  Vicodin; no HAs reported and she's taking a lot of supplements...    Hx anxiety/depression> on Zoloft50 & she is doing satis on this dose, she wants to wean off & suggested to cut to 1/2 then Qod til gone... We reviewed prob list, meds, xrays and labs> see below for updates >>   LABS 6/16:  FLP- not at goals on diet alone;  Chems- wnl;  CBC- wnl;  TSH=1.83  ~  November 09, 2015:  31mo ROV & CPX>  Kristen [name] reports a good yr overall, just c/o decr energy "like I could sleep all day" & notes it's worse in the hot humid weather; we discussed checking her routine labs for any abn that might help explain her fatigue, otherw needs DIET & wt reduction, exercise to incr stamina...  We reviewed the following medical problems during today's office visit >>     Mixed Hyperlipid> on Atorva10 (intol Simva w/ joint pain & Cres too $$); needs better diet; FLP 8/17 showed TChol 158, TG 282, HDL 37, LDL 88-- needs better low fat diet & wt reduction...    Polycystic Ovarian Dis> followed by Concha [place]- DrHaygood w/ prev heavy menses & fibroids=> s/p [facility] 7/15 and now off hormones on Estrace cream...    ORTHO> followed for sports med by DrZSmith, bursitis in right shoulder w/ shot & exercises- improved; has some LBP- on PT & exercises...     VitD defic> prev on 50K weekly per Gyn but off now & VitD level done by Gyn & ok per pt report...    Hx Migraines> prev on Mobic15 & Imitrex100 prn; off prev Topamax & Vicodin; no HAs reported and she's taking a lot of supplements ("natural products")...    Hx anxiety/depression> on Zoloft50 (1/2 of the 100) & she is doing satis on this dose & med refilled... EXAM shows Afeb, VSS, O2sat=98% on RA;  Wt=173# (up 13# this last yr) & BMI=33;  HEENT- neg, mallampati2;  Chest- clear w/o w/r/r;  Heart- RR w/o m/r/g;  Abd- soft nontender neg;  Wxt- neg w/o c/c/e;  Neuro- intact...   LABS 11/09/15>  FLP- Chol ok on Atorva10 but elev TG & needs better diet & exerc;  Chems- wnl;  CBC- wnl;   TSH=0.96 IMP/PLAN>>  Kristen [name] is stable- we discussed the need for incr exercise, low carb/ low fat diet & wt reduction; labs are all ok; continue f/u w/ GYN- DrHaygood...   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 ~  [place] 9/08 showed TChol 146, TG 143, HDL 48, LDL 70... Chol is good on Simva20. ~  [place] 12/09 showed TChol 159, TG 165, HDL 34, LDL 92... reminder- low fat diet... ~  [place] 1/11 showed TChol 182, TG 294, HDL 48, LDL 75... OK on Simva20, may need Fibrate. ~  FLP 1/12 showed TChol 141, TG 216, HDL 46,LDL 70... contin same w/ low fat diet. ~  FLP 1/13 on Simva20 showed TChol 167, TG 194, HDL 45, LDL 83... rec same med, better low fat diet... ~  [place] 1/14 on Simva20 showed TChol 151, TG 413, HDL 33, LDL 70... rec to add FENOFIBRATE160/d=> she stopped due to $$ ~  FLP 2/15 on Simva20 showed TChol 170, TG 178, HDL 48, LDL 87 ~  NOTE> she subseq stopped the Simva20 (joint pain) & states she's "doing cholesterol w/ diet" ~  FLP 6/16 on diet alone showed TChol 250, TG 238, HDL 35, LDL 164; she was intol Simva20, therefore try Cres5...  Hx of MONONUCLEOSIS (ICD-075) - occured at age 67... no known residual.  POLYCYSTIC OVARIAN DISEASE (ICD-256.4) - followed by DrVHaygood who has her on BCPs/ hormones adjusted... ~  [facility]11[facility]  [facility] & improved... ~  1/13:  Recurrent Migraines & DrHaygood has adjusted meds & added MIDRIN w/ improvement... ~  1/14 & 2/15:  She continues to follow up w/ DrHaygood for GYN... ~  7/15: she had TVH by [facility]... ~  2/16: f/u OV w/ Gyn, DrHaygood> note reviewed, placed on Estrace cream 2x/wk, she monitors VitD levels...  ORTHO issues evaluated & treated by Dr. Charlann Boxer, DO... Hx of PLANTAR FASCIITIS  (ICD-728.71) - improved w/ soaks and anti-inflamm meds... ~  2/15: she had f/u OV w/ DrZSmith> LBP & shoulder pain (s/p injection- improved), treated w/ PT & exercises...  Hx of FRACTURE, COCCYX (ICD-805.6)  VITAMIN D DEFICIENCY (ICD-268.9) - she tells me that her VitD level was low when checked by GYN & she's been placed on 50,000 u weekly... ~  She stopped the 50K suppplement & taking OTC supplement now, labs checked by Gyn she says...  MIGRAINES >> MIDRIN really helps & is back on this med... ~  She was evaluated & treated by DrFreeman's HA clinic on TOPAMAX 100mg /d & IMITREX 100mg  prn... ~  Off all migraine meds and doing well w/o recurrent HAs on a number ofr supplements...  ANXIETY/ DEPRESSION >> started on Zoloft 50mg /d by [facility] in 2012; she reports that this is helping & pt requests Zoloft 100mg - take 1/2 qd... ~  6/16:  She wants to wean off the Zoloft50- asked to decr to 1/2 tab, then Qod til gone...  ANEMIA >> she was anemic after the Hysterectomy 7/15 & placed on Iron, monitored by [facility] & subseq stopped- we don't have her labs... ~  Labs here 6/16 showed Hg= 15.3   Past Surgical History:  Procedure Laterality Date  . LAPAROSCOPIC ASSISTED VAGINAL HYSTERECTOMY N/A 09/25/2013   Procedure: LAPAROSCOPIC ASSISTED VAGINAL HYSTERECTOMY;  Surgeon: Eldred Manges, MD;  Location: [facility];  Service: Gynecology;  Laterality: N/A;  . TONSILLECTOMY AND ADENOIDECTOMY     at  age 48    Outpatient Encounter Prescriptions as of 11/07/2016  Medication Sig  . aspirin 81 MG tablet Take 81 mg by mouth daily.  Marland Kitchen CRANBERRY CONCENTRATE PO Take 1 tablet by mouth daily.  Marland Kitchen docusate sodium (COLACE) 100 MG capsule Take 1 capsule (100 mg total) by mouth 2 (two) times daily.  . fish oil-omega-3 fatty acids 1000 MG capsule Take 1 g by mouth daily.   . milk thistle 175 MG tablet Take 175 mg by mouth daily.  . Misc Natural Products (FIBER 7 PO) Take 1 tablet by mouth daily.  . Multiple Vitamin  (MULTIVITAMIN WITH MINERALS) TABS tablet Take 1 tablet by mouth daily.  . niacin 500 MG tablet Take 500 mg by mouth at bedtime.  . sertraline (ZOLOFT) 100 MG tablet Take 0.5 tablets (50 mg total) by mouth daily.  . vitamin C (ASCORBIC ACID) 500 MG tablet Take 500 mg by mouth daily.  . vitamin E 100 UNIT capsule Take 100 Units by mouth daily.  . [DISCONTINUED] atorvastatin (LIPITOR) 10 MG tablet Take 1 tablet (10 mg total) by mouth daily.  . [DISCONTINUED] sertraline (ZOLOFT) 100 MG tablet TAKE 1/2 TABLET BY MOUTH DAILY  . ALPRAZolam (XANAX) 0.5 MG tablet Take 1/2 to 1 tablet by mouth two  times daily as needed for anxiety  . atorvastatin (LIPITOR) 20 MG tablet Take 1/2 tablet by mouth daily  . [DISCONTINUED] sertraline (ZOLOFT) 100 MG tablet Take 0.5 tablets (50 mg total) by mouth daily. (Patient not taking: Reported on 11/07/2016)   No facility-administered encounter medications on file as of 11/07/2016.     No Known Allergies   Immunization History  Administered Date(s) Administered  . Influenza Split 01/13/2011, 01/16/2012, 01/14/2013, 12/12/2013  . Influenza Whole 12/07/2007, 01/25/2010  . Tdap 07/10/2009    Current Medications, Allergies, Past Medical History, Past Surgical History, Family History, and Social History were reviewed in [employer] record.    Review of Systems        The patient denies recurrent headaches.  The patient denies fever, chills, sweats, anorexia, fatigue, weakness, malaise, weight loss, sleep disorder, blurring, diplopia, eye irritation, eye discharge, vision loss, eye pain, photophobia, earache, ear discharge, tinnitus, decreased hearing, nosebleeds, chest pain, palpitations, syncope, dyspnea on exertion, orthopnea, PND, peripheral edema, dyspnea at rest, excessive sputum, hemoptysis, wheezing, pleurisy, nausea, vomiting, diarrhea, constipation, change in bowel habits, abdominal pain, melena, hematochezia, jaundice, gas/bloating,  indigestion/heartburn, dysphagia, odynophagia, dysuria, hematuria, urinary frequency, urinary hesitancy, nocturia, incontinence, back pain, joint pain, joint swelling, muscle cramps, muscle weakness, stiffness, arthritis, sciatica, restless legs, leg pain at night, leg pain with exertion, rash, itching, dryness, suspicious lesions, paralysis, paresthesias, seizures, tremors, vertigo, transient blindness, frequent falls, difficulty walking, depression, anxiety, memory loss, confusion, cold intolerance, heat intolerance, polydipsia, polyphagia, polyuria, unusual weight change, abnormal bruising, bleeding, enlarged lymph nodes, urticaria, allergic rash, hay fever, and recurrent infections.     Objective:   Physical Exam    WD, WN, 46 y/o WF in NAD... GENERAL:  Alert & oriented; pleasant & cooperative... HEENT:  [place]/AT, EOM-wnl, PERRLA, Fundi-benign, EACs-clear, TMs-wnl, NOSE-clear, THROAT-clear & wnl. NECK:  Supple w/ full ROM; no JVD; normal carotid impulses w/o bruits; no thyromegaly or nodules palpated; no lymphadenopathy. CHEST:  Clear to P & A; without wheezes/ rales/ or rhonchi. HEART:  Regular Rhythm; without murmurs/ rubs/ or gallops. ABDOMEN:  Soft & nontender; normal bowel sounds; no organomegaly or masses detected. EXT: without deformities or arthritic changes; no varicose veins/ venous insuffic/ or  edema. NEURO:  CN's intact; motor testing normal; sensory testing normal; gait normal & balance OK. DERM:  No lesions noted; no rash etc...  RADIOLOGY DATA:  Reviewed in the EPIC EMR & discussed w/ the patient...   LABORATORY DATA:  Reviewed in the EPIC EMR & discussed w/ the patient...   Assessment:      CPX>>  11/09/15:  Kristen [name] is stable- we discussed the need for incr exercise, low carb/ low fat diet & wt reduction; labs are all ok; continue f/u w/ GYN- DrHaygood...  Hyperlipidemia>  Chol looked good on Simva20 but she developed joint pain & stopped the statin; on diet & exerc she lost  14# but f/u FLP on diet alone is not at goals w/ TChol250, LDL164, & REC to try Cres5...  Polycystic Ovarian dis>  Managed by [facility]- s/p [facility] 7/15 & off hormones on Estrace vag cream now...  Vit D Defic>  Vit D levels checked by [facility] per pt...  HEADACHES>  Improved w/ hormone adjustment & w/ MIDRIN for prn use... She saw DrFreeman in the past & off all his meds now...  Anxiety/ Depression>  Improved on Zoloft50, & she requests to wean off- ok...     Plan:     Patient's Medications  New Prescriptions   ALPRAZOLAM (XANAX) 0.5 MG TABLET    Take 1/2 to 1 tablet by mouth two  times daily as needed for anxiety   ATORVASTATIN (LIPITOR) 20 MG TABLET    Take 1/2 tablet by mouth daily  Previous Medications   ASPIRIN 81 MG TABLET    Take 81 mg by mouth daily.   CRANBERRY CONCENTRATE PO    Take 1 tablet by mouth daily.   DOCUSATE SODIUM (COLACE) 100 MG CAPSULE    Take 1 capsule (100 mg total) by mouth 2 (two) times daily.   FISH OIL-OMEGA-3 FATTY ACIDS 1000 MG CAPSULE    Take 1 g by mouth daily.    MILK THISTLE 175 MG TABLET    Take 175 mg by mouth daily.   MISC NATURAL PRODUCTS (FIBER 7 PO)    Take 1 tablet by mouth daily.   MULTIPLE VITAMIN (MULTIVITAMIN WITH MINERALS) TABS TABLET    Take 1 tablet by mouth daily.   NIACIN 500 MG TABLET    Take 500 mg by mouth at bedtime.   VITAMIN C (ASCORBIC ACID) 500 MG TABLET    Take 500 mg by mouth daily.   VITAMIN E 100 UNIT CAPSULE    Take 100 Units by mouth daily.  Modified Medications   Modified Medication Previous Medication   SERTRALINE (ZOLOFT) 100 MG TABLET sertraline (ZOLOFT) 100 MG tablet      Take 0.5 tablets (50 mg total) by mouth daily.    TAKE 1/2 TABLET BY MOUTH DAILY  Discontinued Medications   ATORVASTATIN (LIPITOR) 10 MG TABLET    Take 1 tablet (10 mg total) by mouth daily.   SERTRALINE (ZOLOFT) 100 MG TABLET    Take 0.5 tablets (50 mg total) by mouth daily.

## 2016-11-09 MED ORDER — VITAMIN D (ERGOCALCIFEROL) 1.25 MG (50000 UNIT) PO CAPS: 50000.0000 [IU] | ORAL_CAPSULE | ORAL | 1 refills | Status: DC

## 2016-11-09 MED ORDER — ALPRAZOLAM 0.5 MG PO TABS: ORAL_TABLET | ORAL | 3 refills | Status: DC

## 2016-11-09 MED ORDER — SERTRALINE HCL 100 MG PO TABS: 50.0000 mg | ORAL_TABLET | Freq: Every day | ORAL | 1 refills | Status: DC

## 2016-11-09 MED ORDER — ATORVASTATIN CALCIUM 20 MG PO TABS: ORAL_TABLET | ORAL | 1 refills | Status: DC

## 2017-08-16 ENCOUNTER — Other Ambulatory Visit: Payer: Self-pay | Admitting: Obstetrics & Gynecology

## 2017-08-16 DIAGNOSIS — Z1231 Encounter for screening mammogram for malignant neoplasm of breast: Secondary | ICD-10-CM

## 2017-09-22 ENCOUNTER — Ambulatory Visit
Admission: RE | Admit: 2017-09-22 | Discharge: 2017-09-22 | Disposition: A | Discharge status: Home / Self Care | Payer: Self-pay | Source: Ambulatory Visit | Attending: Obstetrics & Gynecology | Admitting: Obstetrics & Gynecology

## 2017-09-22 DIAGNOSIS — Z1231 Encounter for screening mammogram for malignant neoplasm of breast: Secondary | ICD-10-CM

## 2017-10-23 ENCOUNTER — Ambulatory Visit: Payer: Self-pay | Admitting: Pulmonary Disease

## 2017-11-07 ENCOUNTER — Ambulatory Visit: Payer: Self-pay | Admitting: Pulmonary Disease

## 2018-06-27 ENCOUNTER — Encounter: Payer: Self-pay | Admitting: Internal Medicine

## 2018-07-06 ENCOUNTER — Encounter: Payer: Self-pay | Admitting: Internal Medicine

## 2018-07-06 ENCOUNTER — Other Ambulatory Visit: Payer: Self-pay

## 2018-07-06 ENCOUNTER — Ambulatory Visit (INDEPENDENT_AMBULATORY_CARE_PROVIDER_SITE_OTHER): Payer: 59 | Admitting: Internal Medicine

## 2018-07-06 DIAGNOSIS — E782 Mixed hyperlipidemia: Secondary | ICD-10-CM

## 2018-07-06 DIAGNOSIS — E282 Polycystic ovarian syndrome: Secondary | ICD-10-CM | POA: Diagnosis not present

## 2018-07-06 DIAGNOSIS — M722 Plantar fascial fibromatosis: Secondary | ICD-10-CM | POA: Insufficient documentation

## 2018-07-06 DIAGNOSIS — F419 Anxiety disorder, unspecified: Secondary | ICD-10-CM

## 2018-07-06 MED ORDER — SERTRALINE HCL 100 MG PO TABS: 50.0000 mg | ORAL_TABLET | Freq: Every day | ORAL | 1 refills | Status: DC

## 2018-07-06 NOTE — Assessment & Plan Note (Signed)
Will have her set up lab only appt for CMET and Lipid Encouraged her to consume a low fat diet Continue Atorvastatin, Fish Oil, Niacin and ASA

## 2018-07-06 NOTE — Assessment & Plan Note (Signed)
Chronic Well controlled on Sertraline, refilled today Continue Xanax for rare prn use Support offered today Will have her sign CSA and give UDS with annual exam

## 2018-07-06 NOTE — Assessment & Plan Note (Signed)
Currently not an issues Has shoe inserts to wear if needed Will monitor

## 2018-07-06 NOTE — Patient Instructions (Signed)
Fat and Cholesterol Restricted Eating Plan Getting too much fat and cholesterol in your diet may cause health problems. Choosing the right foods helps keep your fat and cholesterol at normal levels. This can keep you from getting certain diseases. Your doctor may recommend an eating plan that includes:  Total fat: ______% or less of total calories a day.  Saturated fat: ______% or less of total calories a day.  Cholesterol: less than _________mg a day.  Fiber: ______g a day. What are tips for following this plan? Meal planning  At meals, divide your plate into four equal parts: ? Fill one-half of your plate with vegetables and green salads. ? Fill one-fourth of your plate with whole grains. ? Fill one-fourth of your plate with low-fat (lean) protein foods.  Eat fish that is high in omega-3 fats at least two times a week. This includes mackerel, tuna, sardines, and salmon.  Eat foods that are high in fiber, such as whole grains, beans, apples, broccoli, carrots, peas, and barley. General tips   Work with your doctor to lose weight if you need to.  Avoid: ? Foods with added sugar. ? Fried foods. ? Foods with partially hydrogenated oils.  Limit alcohol intake to no more than 1 drink a day for nonpregnant women and 2 drinks a day for men. One drink equals 12 oz of beer, 5 oz of wine, or 1 oz of hard liquor. Reading food labels  Check food labels for: ? Trans fats. ? Partially hydrogenated oils. ? Saturated fat (g) in each serving. ? Cholesterol (mg) in each serving. ? Fiber (g) in each serving.  Choose foods with healthy fats, such as: ? Monounsaturated fats. ? Polyunsaturated fats. ? Omega-3 fats.  Choose grain products that have whole grains. Look for the word "whole" as the first word in the ingredient list. Cooking  Cook foods using low-fat methods. These include baking, boiling, grilling, and broiling.  Eat more home-cooked foods. Eat at restaurants and buffets  less often.  Avoid cooking using saturated fats, such as butter, cream, palm oil, palm kernel oil, and coconut oil. Recommended foods  Fruits  All fresh, canned (in natural juice), or frozen fruits. Vegetables  Fresh or frozen vegetables (raw, steamed, roasted, or grilled). Green salads. Grains  Whole grains, such as whole wheat or whole grain breads, crackers, cereals, and pasta. Unsweetened oatmeal, bulgur, barley, quinoa, or brown rice. Corn or whole wheat flour tortillas. Meats and other protein foods  Ground beef (85% or leaner), grass-fed beef, or beef trimmed of fat. Skinless chicken or turkey. Ground chicken or turkey. Pork trimmed of fat. All fish and seafood. Egg whites. Dried beans, peas, or lentils. Unsalted nuts or seeds. Unsalted canned beans. Nut butters without added sugar or oil. Dairy  Low-fat or nonfat dairy products, such as skim or 1% milk, 2% or reduced-fat cheeses, low-fat and fat-free ricotta or cottage cheese, or plain low-fat and nonfat yogurt. Fats and oils  Tub margarine without trans fats. Light or reduced-fat mayonnaise and salad dressings. Avocado. Olive, canola, sesame, or safflower oils. The items listed above may not be a complete list of foods and beverages you can eat. Contact a dietitian for more information. Foods to avoid Fruits  Canned fruit in heavy syrup. Fruit in cream or butter sauce. Fried fruit. Vegetables  Vegetables cooked in cheese, cream, or butter sauce. Fried vegetables. Grains  White bread. White pasta. White rice. Cornbread. Bagels, pastries, and croissants. Crackers and snack foods that contain trans fat   and hydrogenated oils. Meats and other protein foods  Fatty cuts of meat. Ribs, chicken wings, bacon, sausage, bologna, salami, chitterlings, fatback, hot dogs, bratwurst, and packaged lunch meats. Liver and organ meats. Whole eggs and egg yolks. Chicken and turkey with skin. Fried meat. Dairy  Whole or 2% milk, cream,  half-and-half, and cream cheese. Whole milk cheeses. Whole-fat or sweetened yogurt. Full-fat cheeses. Nondairy creamers and whipped toppings. Processed cheese, cheese spreads, and cheese curds. Beverages  Alcohol. Sugar-sweetened drinks such as sodas, lemonade, and fruit drinks. Fats and oils  Butter, stick margarine, lard, shortening, ghee, or bacon fat. Coconut, palm kernel, and palm oils. Sweets and desserts  Corn syrup, sugars, honey, and molasses. Candy. Jam and jelly. Syrup. Sweetened cereals. Cookies, pies, cakes, donuts, muffins, and ice cream. The items listed above may not be a complete list of foods and beverages you should avoid. Contact a dietitian for more information. Summary  Choosing the right foods helps keep your fat and cholesterol at normal levels. This can keep you from getting certain diseases.  At meals, fill one-half of your plate with vegetables and green salads.  Eat high-fiber foods, like whole grains, beans, apples, carrots, peas, and barley.  Limit added sugar, saturated fats, alcohol, and fried foods. This information is not intended to replace advice given to you by your health care provider. Make sure you discuss any questions you have with your health care provider. Document Released: 08/30/2011 Document Revised: 11/01/2017 Document Reviewed: 11/15/2016 Elsevier Interactive Patient Education  2019 [employer]   

## 2018-07-06 NOTE — Assessment & Plan Note (Signed)
Not currently causing any issues Will monitor

## 2018-07-06 NOTE — Progress Notes (Signed)
Virtual Visit via Video Note  I connected with Kristen [name] on 07/06/18 at  2:15 PM EDT by a video enabled telemedicine application and verified that I am speaking with the correct person using two identifiers.   I discussed the limitations of evaluation and management by telemedicine and the availability of in person appointments. The patient expressed understanding and agreed to proceed.  Patient Location: Home Provider Location: Home  History of Present Illness:  Pt establishing care, transferring care from Dr. Lenna Gilford, requesting management of the chronic conditions listed below.  Anxiety: Chronic. Triggered by work and family stress. She reports good symptom control on Sertraline. She takes Xanax maybe 1 x  Month. She is not currently seeing a therapist. She denies depression, SI/HI. She would like a refill of Sertraline today.  Hx of Plantar Fasciitis: Currently not an issue. She has taken Meloxicam in the past with good relief. She does have shoe inserts.  PCOS: s/p partial hysterectomy. She is not on any medication for PCOS at this time.  HLD: Her last LDL was 106, 10/2016. She is taking Atorvastatin as prescribed. She is taking Fish Oil, Niacin and ASA OTC. She has been having some joints pains but denies muscle aches. She has been trying to consume a low fat diet.  Flu: 12/2017 Tetanus: 11/2014 Pap Smear: 2015, partial hysterectomy Mammogram: 09/2017 Colon Screening: Never Vision Screening: As needed Dentist: Annually     Past Medical History:  Diagnosis Date  . Fracture of coccyx ([place])   . History of mononucleosis   . Mixed hyperlipidemia   . Plantar fasciitis   . Polycystic ovarian disease   . PONV (postoperative nausea and vomiting)   . Vitamin D deficiency     Current Outpatient Medications  Medication Sig Dispense Refill  . ALPRAZolam (XANAX) 0.5 MG tablet Take 1/2 to 1 tablet by mouth two  times daily as needed for anxiety 60 tablet 3  . aspirin 81 MG tablet  Take 81 mg by mouth daily.    Marland Kitchen atorvastatin (LIPITOR) 20 MG tablet Take 1/2 tablet by mouth daily (Patient taking differently: Take 1/2 tablet every other day) 180 tablet 1  . Calcium-Magnesium-Vitamin D (CALCIUM 500 PO) Take 1,000 mg by mouth daily.    . Cholecalciferol (VITAMIN D3) 25 MCG (1000 UT) CAPS Take 2,000 Units by mouth daily.    . fish oil-omega-3 fatty acids 1000 MG capsule Take 1 g by mouth daily.     . Multiple Vitamin (MULTIVITAMIN WITH MINERALS) TABS tablet Take 1 tablet by mouth daily.    . niacin 500 MG tablet Take 500 mg by mouth at bedtime.    . sertraline (ZOLOFT) 100 MG tablet Take 0.5 tablets (50 mg total) by mouth daily. 180 tablet 1  . vitamin E 100 UNIT capsule Take 100 Units by mouth daily.    . vitamin C (ASCORBIC ACID) 500 MG tablet Take 500 mg by mouth daily.     No current facility-administered medications for this visit.     No Known Allergies  Family History  Problem Relation Age of Onset  . Hypertension Mother   . Osteoporosis Mother   . Osteoporosis Maternal Grandmother     Social History   Socioeconomic History  . Marital status: Married    Spouse name: Legrand Como  . Number of children: 2  . Years of education: Not on file  . Highest education level: Not on file  Occupational History  . Occupation: Garment/textile technologist:  UNEMPLOYED  Social Needs  . Financial resource strain: Not on file  . Food insecurity:    Worry: Not on file    Inability: Not on file  . Transportation needs:    Medical: Not on file    Non-medical: Not on file  Tobacco Use  . Smoking status: Never Smoker  . Smokeless tobacco: Never Used  Substance and Sexual Activity  . Alcohol use: Yes    Comment: rare use  . Drug use: No  . Sexual activity: Not on file    Comment: necon  Lifestyle  . Physical activity:    Days per week: Not on file    Minutes per session: Not on file  . Stress: Not on file  Relationships  . Social connections:    Talks on phone:  Not on file    Gets together: Not on file    Attends religious service: Not on file    Active member of club or organization: Not on file    Attends meetings of clubs or organizations: Not on file    Relationship status: Not on file  . Intimate partner violence:    Fear of current or ex partner: Not on file    Emotionally abused: Not on file    Physically abused: Not on file    Forced sexual activity: Not on file  Other Topics Concern  . Not on file  Social History Narrative  . Not on file     Constitutional: Pt reports fatigue, weight gain. Denies fever, malaise, or headache.  HEENT: Denies eye pain, eye redness, ear pain, ringing in the ears, wax buildup, runny nose, nasal congestion, bloody nose, or sore throat. Respiratory: Denies difficulty breathing, shortness of breath, cough or sputum production.   Cardiovascular: Denies chest pain, chest tightness, palpitations or swelling in the hands or feet.  Gastrointestinal: Denies abdominal pain, bloating, constipation, diarrhea or blood in the stool.  GU: Denies urgency, frequency, pain with urination, burning sensation, blood in urine, odor or discharge. Musculoskeletal: Pt reports intermittent joint pains. Denies decrease in range of motion, difficulty with gait, muscle pain or joint swelling.  Skin: Denies redness, rashes, lesions or ulcercations.  Neurological: Denies dizziness, difficulty with memory, difficulty with speech or problems with balance and coordination.  Psych: Pt has a history of anxiety. Denies depression, SI/HI.  No other specific complaints in a complete review of systems (except as listed in HPI above). BP 118/80   Wt 153 lb (69.4 kg)   LMP 04/15/2013   BMI 28.91 kg/m  Wt Readings from Last 3 Encounters:  07/06/18 153 lb (69.4 kg)  11/07/16 158 lb 6 oz (71.8 kg)  11/09/15 173 lb (78.5 kg)    General: Appears her stated age, obese, in NAD. Pulmonary/Chest: Normal effort. No respiratory distress.   Abdomen: No distention or masses noted.  Neurological: Alert and oriented.  Psychiatric: Mood and affect normal. Behavior is normal. Judgment and thought content normal.   BMET    Component Value Date/Time   NA 137 11/07/2016 1004   K 4.5 11/07/2016 1004   CL 102 11/07/2016 1004   CO2 29 11/07/2016 1004   GLUCOSE 93 11/07/2016 1004   GLUCOSE 94 01/27/2006 0841   BUN 10 11/07/2016 1004   CREATININE 0.59 11/07/2016 1004   CALCIUM 9.7 11/07/2016 1004   GFRNONAA >90 12/06/2012 1910   GFRAA >90 12/06/2012 1910    Lipid Panel     Component Value Date/Time   CHOL 183 11/07/2016 1004  TRIG 177.0 (H) 11/07/2016 1004   TRIG 141 01/27/2006 0841   HDL 41.60 11/07/2016 1004   CHOLHDL 4 11/07/2016 1004   VLDL 35.4 11/07/2016 1004   LDLCALC 106 (H) 11/07/2016 1004    CBC    Component Value Date/Time   WBC 9.9 11/07/2016 1004   RBC 4.78 11/07/2016 1004   HGB 15.0 11/07/2016 1004   HCT 44.3 11/07/2016 1004   PLT 246.0 11/07/2016 1004   MCV 92.7 11/07/2016 1004   MCH 24.8 (L) 09/26/2013 0530   MCHC 33.8 11/07/2016 1004   RDW 13.0 11/07/2016 1004   LYMPHSABS 3.3 11/07/2016 1004   MONOABS 0.6 11/07/2016 1004   EOSABS 0.3 11/07/2016 1004   BASOSABS 0.1 11/07/2016 1004    Hgb A1C Lab Results  Component Value Date   HGBA1C 5.0 01/27/2006         Assessment and Plan:  See problem based charting  Follow Up Instructions:    I discussed the assessment and treatment plan with the patient. The patient was provided an opportunity to ask questions and all were answered. The patient agreed with the plan and demonstrated an understanding of the instructions.   The patient was advised to call back or seek an in-person evaluation if the symptoms worsen or if the condition fails to improve as anticipated.   Webb Silversmith, NP

## 2018-09-07 ENCOUNTER — Encounter: Payer: 59 | Admitting: Internal Medicine

## 2018-09-20 ENCOUNTER — Other Ambulatory Visit: Payer: Self-pay | Admitting: Internal Medicine

## 2018-09-20 DIAGNOSIS — Z1231 Encounter for screening mammogram for malignant neoplasm of breast: Secondary | ICD-10-CM

## 2018-10-19 ENCOUNTER — Other Ambulatory Visit: Payer: Self-pay

## 2018-10-19 ENCOUNTER — Ambulatory Visit (INDEPENDENT_AMBULATORY_CARE_PROVIDER_SITE_OTHER): Payer: 59 | Admitting: Internal Medicine

## 2018-10-19 ENCOUNTER — Encounter: Payer: Self-pay | Admitting: Internal Medicine

## 2018-10-19 VITALS — BP 120/84 | HR 77 | Temp 97.0°F | Ht 61.0 in | Wt 174.0 lb

## 2018-10-19 DIAGNOSIS — R232 Flushing: Secondary | ICD-10-CM | POA: Diagnosis not present

## 2018-10-19 DIAGNOSIS — E782 Mixed hyperlipidemia: Secondary | ICD-10-CM

## 2018-10-19 DIAGNOSIS — Z114 Encounter for screening for human immunodeficiency virus [HIV]: Secondary | ICD-10-CM | POA: Diagnosis not present

## 2018-10-19 DIAGNOSIS — R5383 Other fatigue: Secondary | ICD-10-CM

## 2018-10-19 DIAGNOSIS — E559 Vitamin D deficiency, unspecified: Secondary | ICD-10-CM | POA: Diagnosis not present

## 2018-10-19 DIAGNOSIS — F419 Anxiety disorder, unspecified: Secondary | ICD-10-CM

## 2018-10-19 DIAGNOSIS — Z0000 Encounter for general adult medical examination without abnormal findings: Secondary | ICD-10-CM

## 2018-10-19 DIAGNOSIS — Z0001 Encounter for general adult medical examination with abnormal findings: Secondary | ICD-10-CM

## 2018-10-19 DIAGNOSIS — Z1211 Encounter for screening for malignant neoplasm of colon: Secondary | ICD-10-CM | POA: Diagnosis not present

## 2018-10-19 MED ORDER — HYDROXYZINE HCL 10 MG PO TABS: 10.0000 mg | ORAL_TABLET | Freq: Every evening | ORAL | 0 refills | Status: DC | PRN

## 2018-10-19 NOTE — Patient Instructions (Signed)

## 2018-10-19 NOTE — Progress Notes (Signed)
Subjective:    Patient ID: Kristen [name], female    DOB: 10/05/1970, 48 y.o.   MRN: [id number]  HPI  Pt presents to the clinic today for her annual exam.  Flu: 12/2017 Tetanus: 11/2014 Pap Smear: 2015, partial hysterectomy Mammogram: 09/2017, scheduled 10/2018 Colon Screening: never Vision Screening: as needed Dentist: biannually  Diet: She does eat meat. She consumes fruits and veggies daily. She tries to avoid fried foods. She drinks mostly water, half sweet tea. Exercise: None  Review of Systems      Past Medical History:  Diagnosis Date  . Fracture of coccyx ([place])   . History of mononucleosis   . Mixed hyperlipidemia   . Plantar fasciitis   . Polycystic ovarian disease   . PONV (postoperative nausea and vomiting)   . Vitamin D deficiency     Current Outpatient Medications  Medication Sig Dispense Refill  . ALPRAZolam (XANAX) 0.5 MG tablet Take 1/2 to 1 tablet by mouth two  times daily as needed for anxiety 60 tablet 3  . aspirin 81 MG tablet Take 81 mg by mouth daily.    Marland Kitchen atorvastatin (LIPITOR) 20 MG tablet Take 1/2 tablet by mouth daily (Patient taking differently: Take 1/2 tablet every other day) 180 tablet 1  . Calcium-Magnesium-Vitamin D (CALCIUM 500 PO) Take 1,000 mg by mouth daily.    . Cholecalciferol (VITAMIN D3) 25 MCG (1000 UT) CAPS Take 2,000 Units by mouth daily.    . fish oil-omega-3 fatty acids 1000 MG capsule Take 1 g by mouth daily.     . Multiple Vitamin (MULTIVITAMIN WITH MINERALS) TABS tablet Take 1 tablet by mouth daily.    . niacin 500 MG tablet Take 500 mg by mouth at bedtime.    . sertraline (ZOLOFT) 100 MG tablet Take 0.5 tablets (50 mg total) by mouth daily. 180 tablet 1  . vitamin C (ASCORBIC ACID) 500 MG tablet Take 500 mg by mouth daily.    . vitamin E 100 UNIT capsule Take 100 Units by mouth daily.     No current facility-administered medications for this visit.     No Known Allergies  Family History  Problem Relation Age of  Onset  . Hypertension Mother   . Osteoporosis Mother   . Osteoporosis Maternal Grandmother     Social History   Socioeconomic History  . Marital status: Married    Spouse name: Legrand Como  . Number of children: 2  . Years of education: Not on file  . Highest education level: Not on file  Occupational History  . Occupation: Garment/textile technologist: UNEMPLOYED  Social Needs  . Financial resource strain: Not on file  . Food insecurity    Worry: Not on file    Inability: Not on file  . Transportation needs    Medical: Not on file    Non-medical: Not on file  Tobacco Use  . Smoking status: Never Smoker  . Smokeless tobacco: Never Used  Substance and Sexual Activity  . Alcohol use: Yes    Comment: rare use  . Drug use: No  . Sexual activity: Not on file    Comment: necon  Lifestyle  . Physical activity    Days per week: Not on file    Minutes per session: Not on file  . Stress: Not on file  Relationships  . Social Herbalist on phone: Not on file    Gets together: Not on file  Attends religious service: Not on file    Active member of club or organization: Not on file    Attends meetings of clubs or organizations: Not on file    Relationship status: Not on file  . Intimate partner violence    Fear of current or ex partner: Not on file    Emotionally abused: Not on file    Physically abused: Not on file    Forced sexual activity: Not on file  Other Topics Concern  . Not on file  Social History Narrative  . Not on file     Constitutional: Pt reports weight gain, fatigue. Denies fever, malaise, headache.  HEENT: Denies eye pain, eye redness, ear pain, ringing in the ears, wax buildup, runny nose, nasal congestion, bloody nose, or sore throat. Respiratory: Denies difficulty breathing, shortness of breath, cough or sputum production.   Cardiovascular: Denies chest pain, chest tightness, palpitations or swelling in the hands or feet.  Gastrointestinal:  Denies abdominal pain, bloating, constipation, diarrhea or blood in the stool.  GU: Denies urgency, frequency, pain with urination, burning sensation, blood in urine, odor or discharge. Musculoskeletal: Pt reports intermittent back pain. Denies decrease in range of motion, difficulty with gait, muscle pain or joint swelling.  Skin: Denies redness, rashes, lesions or ulcercations.  Neurological: Pt reports hot flashes. Denies dizziness, difficulty with memory, difficulty with speech or problems with balance and coordination.  Psych: Pt has a history of anxiety. Denies depression, SI/HI.  No other specific complaints in a complete review of systems (except as listed in HPI above).  Objective:   Physical Exam   BP 120/84   Pulse 77   Temp (!) 97 F (36.1 C) (Temporal)   Ht 5\' 1"  (1.549 m)   Wt 174 lb (78.9 kg)   LMP 04/15/2013   SpO2 98%   BMI 32.88 kg/m  Wt Readings from Last 3 Encounters:  10/19/18 174 lb (78.9 kg)  07/06/18 153 lb (69.4 kg)  11/07/16 158 lb 6 oz (71.8 kg)    General: Appears her stated age, obese in NAD. Skin: Warm, dry and intact. No rashes noted. HEENT: Head: normal shape and size; Eyes: sclera white, no icterus, conjunctiva pink, PERRLA and EOMs intact; Ears: Tm's gray and intact, normal light reflex;  Neck:  Neck supple, trachea midline. No masses, lumps or thyromegaly present.  Cardiovascular: Normal rate and rhythm. S1,S2 noted.  No murmur, rubs or gallops noted. No JVD or BLE edema. No carotid bruits noted. Pulmonary/Chest: Normal effort and positive vesicular breath sounds. No respiratory distress. No wheezes, rales or ronchi noted.  Abdomen: Soft and nontender. Normal bowel sounds. No distention or masses noted. Liver, spleen and kidneys non palpable. Musculoskeletal: Strength 5/5 BUE/BLE. No difficulty with gait.  Neurological: Alert and oriented. Cranial nerves II-XII grossly intact. Coordination normal.  Psychiatric: Mood and affect normal. Behavior  is normal. Judgment and thought content normal.   BMET    Component Value Date/Time   NA 137 11/07/2016 1004   K 4.5 11/07/2016 1004   CL 102 11/07/2016 1004   CO2 29 11/07/2016 1004   GLUCOSE 93 11/07/2016 1004   GLUCOSE 94 01/27/2006 0841   BUN 10 11/07/2016 1004   CREATININE 0.59 11/07/2016 1004   CALCIUM 9.7 11/07/2016 1004   GFRNONAA >90 12/06/2012 1910   GFRAA >90 12/06/2012 1910    Lipid Panel     Component Value Date/Time   CHOL 183 11/07/2016 1004   TRIG 177.0 (H) 11/07/2016 1004  TRIG 141 01/27/2006 0841   HDL 41.60 11/07/2016 1004   CHOLHDL 4 11/07/2016 1004   VLDL 35.4 11/07/2016 1004   LDLCALC 106 (H) 11/07/2016 1004    CBC    Component Value Date/Time   WBC 9.9 11/07/2016 1004   RBC 4.78 11/07/2016 1004   HGB 15.0 11/07/2016 1004   HCT 44.3 11/07/2016 1004   PLT 246.0 11/07/2016 1004   MCV 92.7 11/07/2016 1004   MCH 24.8 (L) 09/26/2013 0530   MCHC 33.8 11/07/2016 1004   RDW 13.0 11/07/2016 1004   LYMPHSABS 3.3 11/07/2016 1004   MONOABS 0.6 11/07/2016 1004   EOSABS 0.3 11/07/2016 1004   BASOSABS 0.1 11/07/2016 1004    Hgb A1C Lab Results  Component Value Date   HGBA1C 5.0 01/27/2006          Assessment & Plan:   Preventative Health Maintenance:  Encouraged her to get a flu shot in the fall Tetanus UTD She declines pelvic exam today Mammogram scheduled Referral to GI for screening colonoscopy Encouraged her to consume a balanced diet and exercise regimen Advised her to see an eye doctor and dentist annually Will check CBC, CMET, TSH, Lipid, A1C, HIV, Vit D, B12, FSH/LH  Will follow up after labs, return precautions discussed Webb Silversmith, NP

## 2018-10-19 NOTE — Assessment & Plan Note (Signed)
Deteriorated Pt switched from Sertraline to Citalopram on her own- reports she is doing well but having trouble sleeping at night RX for Hydroxyzine 10 mg QHS prn

## 2018-10-20 LAB — VITAMIN B12: Vitamin B-12: 886 pg/mL (ref 200–1100)

## 2018-10-20 LAB — LIPID PANEL
Cholesterol: 219 mg/dL — ABNORMAL HIGH (ref <200)
HDL: 45 mg/dL — ABNORMAL LOW (ref >=50)
LDL Cholesterol (Calc): 133 mg/dL (calc) — ABNORMAL HIGH
Non-HDL Cholesterol (Calc): 174 mg/dL (calc) — ABNORMAL HIGH (ref <130)
Total CHOL/HDL Ratio: 4.9 (calc) (ref <5.0)
Triglycerides: 267 mg/dL — ABNORMAL HIGH (ref <150)

## 2018-10-20 LAB — HEMOGLOBIN A1C
Hgb A1c MFr Bld: 5.6 % of total Hgb (ref <5.7)
Mean Plasma Glucose: 114 (calc)
eAG (mmol/L): 6.3 (calc)

## 2018-10-20 LAB — COMPREHENSIVE METABOLIC PANEL
AG Ratio: 1.7 (calc) (ref 1.0–2.5)
ALT: 35 U/L — ABNORMAL HIGH (ref 6–29)
AST: 25 U/L (ref 10–35)
Albumin: 4.7 g/dL (ref 3.6–5.1)
Alkaline phosphatase (APISO): 51 U/L (ref 37–153)
BUN: 14 mg/dL (ref 7–25)
CO2: 25 mmol/L (ref 20–32)
Calcium: 10.4 mg/dL (ref 8.6–10.4)
Chloride: 100 mmol/L (ref 98–110)
Creat: 0.62 mg/dL (ref 0.50–1.05)
Globulin: 2.8 g/dL (calc) (ref 1.9–3.7)
Glucose, Bld: 81 mg/dL (ref 65–99)
Potassium: 4.2 mmol/L (ref 3.5–5.3)
Sodium: 138 mmol/L (ref 135–146)
Total Bilirubin: 0.6 mg/dL (ref 0.2–1.2)
Total Protein: 7.5 g/dL (ref 6.1–8.1)

## 2018-10-20 LAB — CBC
HCT: 44.2 % (ref 35.0–45.0)
Hemoglobin: 15.2 g/dL (ref 11.7–15.5)
MCH: 31 pg (ref 27.0–33.0)
MCHC: 34.4 g/dL (ref 32.0–36.0)
MCV: 90 fL (ref 80.0–100.0)
MPV: 10.5 fL (ref 7.5–12.5)
Platelets: 256 10*3/uL (ref 140–400)
RBC: 4.91 10*6/uL (ref 3.80–5.10)
RDW: 12.4 % (ref 11.0–15.0)
WBC: 9.8 10*3/uL (ref 3.8–10.8)

## 2018-10-20 LAB — TSH: TSH: 1.3 mIU/L

## 2018-10-20 LAB — VITAMIN D 25 HYDROXY (VIT D DEFICIENCY, FRACTURES): Vit D, 25-Hydroxy: 20 ng/mL — ABNORMAL LOW (ref 30–100)

## 2018-10-20 LAB — FOLLICLE STIMULATING HORMONE: FSH: 81 m[IU]/mL

## 2018-10-20 LAB — LUTEINIZING HORMONE: LH: 44.4 m[IU]/mL

## 2018-10-20 LAB — HIV ANTIBODY (ROUTINE TESTING W REFLEX): HIV 1&2 Ab, 4th Generation: NONREACTIVE

## 2018-10-25 MED ORDER — ATORVASTATIN CALCIUM 20 MG PO TABS: 20.0000 mg | ORAL_TABLET | Freq: Every day | ORAL | 0 refills | Status: DC

## 2018-10-25 MED ORDER — VITAMIN D (ERGOCALCIFEROL) 1.25 MG (50000 UNIT) PO CAPS: 50000.0000 [IU] | ORAL_CAPSULE | ORAL | 0 refills | Status: DC

## 2018-10-25 NOTE — Addendum Note (Signed)
Addended by: Lurlean Nanny on: 10/25/2018 04:38 PM   Modules accepted: Orders

## 2018-10-26 ENCOUNTER — Encounter: Payer: Self-pay | Admitting: Gastroenterology

## 2018-11-02 ENCOUNTER — Ambulatory Visit
Admission: RE | Admit: 2018-11-02 | Discharge: 2018-11-02 | Disposition: A | Discharge status: Home / Self Care | Payer: 59 | Source: Ambulatory Visit | Attending: Internal Medicine | Admitting: Internal Medicine

## 2018-11-02 ENCOUNTER — Other Ambulatory Visit: Payer: Self-pay

## 2018-11-02 DIAGNOSIS — Z1231 Encounter for screening mammogram for malignant neoplasm of breast: Secondary | ICD-10-CM

## 2018-11-08 ENCOUNTER — Encounter: Payer: Self-pay | Admitting: Internal Medicine

## 2018-11-09 ENCOUNTER — Ambulatory Visit (AMBULATORY_SURGERY_CENTER): Payer: Self-pay | Admitting: *Deleted

## 2018-11-09 ENCOUNTER — Other Ambulatory Visit: Payer: Self-pay

## 2018-11-09 VITALS — Temp 97.6°F | Ht 61.0 in | Wt 173.0 lb

## 2018-11-09 DIAGNOSIS — Z1211 Encounter for screening for malignant neoplasm of colon: Secondary | ICD-10-CM

## 2018-11-09 MED ORDER — NA SULFATE-K SULFATE-MG SULF 17.5-3.13-1.6 GM/177ML PO SOLN: 1.0000 | Freq: Once | ORAL | 0 refills | Status: AC

## 2018-11-09 NOTE — Progress Notes (Signed)
No egg or soy allergy known to patient  No issues with past sedation with any surgeries  or procedures, no intubation problems  No diet pills per patient No home 02 use per patient  No blood thinners per patient  Pt denies issues with constipation  No A fib or A flutter  EMMI video sent to pt's e mail  

## 2018-11-12 MED ORDER — ALPRAZOLAM 0.5 MG PO TABS: 0.5000 mg | ORAL_TABLET | Freq: Every day | ORAL | 0 refills | Status: DC | PRN

## 2018-11-12 MED ORDER — HYDROXYZINE HCL 10 MG PO TABS: 10.0000 mg | ORAL_TABLET | Freq: Every evening | ORAL | 1 refills | Status: DC | PRN

## 2018-11-30 ENCOUNTER — Ambulatory Visit (AMBULATORY_SURGERY_CENTER): Payer: 59 | Admitting: Gastroenterology

## 2018-11-30 ENCOUNTER — Other Ambulatory Visit: Payer: Self-pay

## 2018-11-30 ENCOUNTER — Encounter: Payer: Self-pay | Admitting: Gastroenterology

## 2018-11-30 ENCOUNTER — Other Ambulatory Visit: Payer: Self-pay | Admitting: Gastroenterology

## 2018-11-30 VITALS — BP 121/77 | HR 86 | Temp 98.0°F | Resp 16 | Ht 61.0 in | Wt 173.0 lb

## 2018-11-30 DIAGNOSIS — D124 Benign neoplasm of descending colon: Secondary | ICD-10-CM

## 2018-11-30 DIAGNOSIS — Z1211 Encounter for screening for malignant neoplasm of colon: Secondary | ICD-10-CM | POA: Diagnosis present

## 2018-11-30 DIAGNOSIS — D128 Benign neoplasm of rectum: Secondary | ICD-10-CM | POA: Diagnosis not present

## 2018-11-30 MED ORDER — SODIUM CHLORIDE 0.9 % IV SOLN: 500.0000 mL | Freq: Once | INTRAVENOUS | Status: DC

## 2018-11-30 NOTE — Progress Notes (Signed)
Called to room to assist during endoscopic procedure.  Patient ID and intended procedure confirmed with present staff. Received instructions for my participation in the procedure from the performing physician.  

## 2018-11-30 NOTE — Patient Instructions (Signed)
Handout on polyps and diverticulosis given   YOU HAD AN ENDOSCOPIC PROCEDURE TODAY AT THE [facility]:   Refer to the procedure report that was given to you for any specific questions about what was found during the examination.  If the procedure report does not answer your questions, please call your gastroenterologist to clarify.  If you requested that your care partner not be given the details of your procedure findings, then the procedure report has been included in a sealed envelope for you to review at your convenience later.  YOU SHOULD EXPECT: Some feelings of bloating in the abdomen. Passage of more gas than usual.  Walking can help get rid of the air that was put into your GI tract during the procedure and reduce the bloating. If you had a lower endoscopy (such as a colonoscopy or flexible sigmoidoscopy) you may notice spotting of blood in your stool or on the toilet paper. If you underwent a bowel prep for your procedure, you may not have a normal bowel movement for a few days.  Please Note:  You might notice some irritation and congestion in your nose or some drainage.  This is from the oxygen used during your procedure.  There is no need for concern and it should clear up in a day or so.  SYMPTOMS TO REPORT IMMEDIATELY:   Following lower endoscopy (colonoscopy or flexible sigmoidoscopy):  Excessive amounts of blood in the stool  Significant tenderness or worsening of abdominal pains  Swelling of the abdomen that is new, acute  Fever of 100F or higher   For urgent or emergent issues, a gastroenterologist can be reached at any hour by calling [phone].   DIET:  We do recommend a small meal at first, but then you may proceed to your regular diet.  Drink plenty of fluids but you should avoid alcoholic beverages for 24 hours.  ACTIVITY:  You should plan to take it easy for the rest of today and you should NOT DRIVE or use heavy machinery until tomorrow (because of  the sedation medicines used during the test).    FOLLOW UP: Our staff will call the number listed on your records 48-72 hours following your procedure to check on you and address any questions or concerns that you may have regarding the information given to you following your procedure. If we do not reach you, we will leave a message.  We will attempt to reach you two times.  During this call, we will ask if you have developed any symptoms of COVID 19. If you develop any symptoms (ie: fever, flu-like symptoms, shortness of breath, cough etc.) before then, please call [phone].  If you test positive for Covid 19 in the 2 weeks post procedure, please call and report this information to us.    If any biopsies were taken you will be contacted by phone or by letter within the next 1-3 weeks.  Please call us at [phone] if you have not heard about the biopsies in 3 weeks.    SIGNATURES/CONFIDENTIALITY: You and/or your care partner have signed paperwork which will be entered into your electronic medical record.  These signatures attest to the fact that that the information above on your After Visit Summary has been reviewed and is understood.  Full responsibility of the confidentiality of this discharge information lies with you and/or your care-partner. 

## 2018-11-30 NOTE — Op Note (Signed)
[place] Patient Name: Kristen [name] Procedure Date: 11/30/2018 12:00 PM MRN: [id number] Endoscopist: Mallie Mussel L. Loletha Carrow , MD Age: 48 Referring MD:  Date of Birth: May 19, 1970 Gender: Female Account #: [id number] Procedure:                Colonoscopy Indications:              Screening for colorectal malignant neoplasm, This                            is the patient's first colonoscopy Medicines:                Monitored Anesthesia Care Procedure:                Pre-Anesthesia Assessment:                           - Prior to the procedure, a History and Physical                            was performed, and patient medications and                            allergies were reviewed. The patient's tolerance of                            previous anesthesia was also reviewed. The risks                            and benefits of the procedure and the sedation                            options and risks were discussed with the patient.                            All questions were answered, and informed consent                            was obtained. Prior Anticoagulants: The patient has                            taken no previous anticoagulant or antiplatelet                            agents except for aspirin. ASA Grade Assessment: I                            - A normal, healthy patient. After reviewing the                            risks and benefits, the patient was deemed in                            satisfactory condition to undergo the procedure.  After obtaining informed consent, the colonoscope                            was passed under direct vision. Throughout the                            procedure, the patient's blood pressure, pulse, and                            oxygen saturations were monitored continuously. The                            Colonoscope was introduced through the anus and                            advanced to the the  terminal ileum, with                            identification of the appendiceal orifice and IC                            valve. The colonoscopy was performed without                            difficulty. The patient tolerated the procedure                            well. The quality of the bowel preparation was                            good. The ileocecal valve, appendiceal orifice, and                            rectum were photographed. Scope In: 12:08:24 PM Scope Out: 12:27:15 PM Scope Withdrawal Time: 0 hours 16 minutes 47 seconds  Total Procedure Duration: 0 hours 18 minutes 51 seconds  Findings:                 The perianal and digital rectal examinations were                            normal.                           A few small-mouthed diverticula were found in the                            descending colon.                           A diminutive polyp was found in the descending                            colon. The polyp was sessile. The polyp was removed  with a cold snare. Resection and retrieval were                            complete.                           Two sessile polyps were found in the rectum. The                            polyps were 3 to 6 mm in size. These polyps were                            removed with a cold snare. Resection and retrieval                            were complete.                           Retroflexion in the rectum was not performed due to                            narrow anatomy.                           The exam was otherwise without abnormality. Complications:            No immediate complications. Estimated Blood Loss:     Estimated blood loss was minimal. Impression:               - Diverticulosis in the descending colon.                           - One diminutive polyp in the descending colon,                            removed with a cold snare. Resected and retrieved.                            - Two 3 to 6 mm polyps in the rectum, removed with                            a cold snare. Resected and retrieved.                           - The examination was otherwise normal. Recommendation:           - Patient has a contact number available for                            emergencies. The signs and symptoms of potential                            delayed complications were discussed with the  patient. Return to normal activities tomorrow.                            Written discharge instructions were provided to the                            patient.                           - Resume previous diet.                           - Continue present medications.                           - Await pathology results.                           - Repeat colonoscopy is recommended for                            surveillance. The colonoscopy date will be                            determined after pathology results from today's                            exam become available for review. Lucky Alverson L. Loletha Carrow, MD 11/30/2018 12:32:11 PM This report has been signed electronically.

## 2018-11-30 NOTE — Progress Notes (Signed)
Temp check by KA/vital check by CW.  No medical or surgical history changes from pre-visit screening on 8/28.

## 2018-11-30 NOTE — Progress Notes (Signed)
Report given to PACU, vss 

## 2018-12-04 ENCOUNTER — Telehealth: Payer: Self-pay | Admitting: *Deleted

## 2018-12-04 ENCOUNTER — Encounter: Payer: Self-pay | Admitting: Gastroenterology

## 2018-12-04 NOTE — Telephone Encounter (Signed)
1. Have you developed a fever since your procedure? no  2.   Have you had an respiratory symptoms (SOB or cough) since your procedure? no  3.   Have you tested positive for COVID 19 since your procedure ? no  4.   Have you had any family members/close contacts diagnosed with the COVID 19 since your procedure?  no   If yes to any of these questions please route to Joylene John, RN and Alphonsa Gin, Therapist, sports.  Follow up Call-  Call back number 11/30/2018  Post procedure Call Back phone  # [phone]  Permission to leave phone message Yes  Some recent data might be hidden     Patient questions:  Do you have a fever, pain , or abdominal swelling? No. Pain Score  0 *  Have you tolerated food without any problems? Yes.    Have you been able to return to your normal activities? Yes.    Do you have any questions about your discharge instructions: Diet   No. Medications  No. Follow up visit  No.  Do you have questions or concerns about your Care? No.  Actions: * If pain score is 4 or above: No action needed, pain <4.

## 2018-12-25 ENCOUNTER — Encounter: Payer: Self-pay | Admitting: Internal Medicine

## 2019-01-03 ENCOUNTER — Encounter: Payer: Self-pay | Admitting: Obstetrics and Gynecology

## 2019-01-03 ENCOUNTER — Other Ambulatory Visit: Payer: Self-pay

## 2019-01-03 ENCOUNTER — Ambulatory Visit (INDEPENDENT_AMBULATORY_CARE_PROVIDER_SITE_OTHER): Payer: 59 | Admitting: Obstetrics and Gynecology

## 2019-01-03 VITALS — BP 128/88 | HR 81 | Ht 60.0 in | Wt 174.2 lb

## 2019-01-03 DIAGNOSIS — N816 Rectocele: Secondary | ICD-10-CM | POA: Diagnosis not present

## 2019-01-03 DIAGNOSIS — K5902 Outlet dysfunction constipation: Secondary | ICD-10-CM | POA: Diagnosis not present

## 2019-01-03 MED ORDER — PSYLLIUM 58.6 % PO POWD: 1.0000 | Freq: Two times a day (BID) | ORAL | 12 refills | Status: DC

## 2019-01-03 MED ORDER — POLYETHYLENE GLYCOL 3350 17 GM/SCOOP PO POWD: 1.0000 | Freq: Two times a day (BID) | ORAL | 2 refills | Status: DC

## 2019-01-03 NOTE — Progress Notes (Signed)
Obstetrics and Gynecology New Patient Evaluation  Appointment Date: 01/03/2019  OBGYN Clinic: [facility]  Primary Care Provider: Jearld Fenton  Referring Provider: Jearld Fenton, NP  Chief Complaint: vaginal mass  History of Present Illness: Kristen [name] is a 48 y.o. Caucasian G2P2 (LMP: h/o TVH), seen for the above chief complaint. Her past medical history is significant for h/o TVH (benign reasons), BMI 34, vag delivery x 2  About 4-5 weeks ago she felt a mass with wiping and looked int he mirror. She saw a mass and felt a mass on the posterior aspect of the vagina. She thinks it looks about the same and she checks it every few days. No prior history. It started soon after her 9/18 colonoscopy. +climateric s/s and has no OAB but has mild, chronic SUI s/s, some constipation; no pain with sex, VB or spotting, discharge, blood with BM    Review of Systems: as noted in the History of Present Illness.  Past Medical History:  Past Medical History:  Diagnosis Date  . Allergy    SEASONAL  . Anemia   . Anxiety   . Fracture of coccyx ([place])   . History of mononucleosis   . Mixed hyperlipidemia   . Plantar fasciitis   . Polycystic ovarian disease   . PONV (postoperative nausea and vomiting)   . Vitamin D deficiency     Past Surgical History:  Past Surgical History:  Procedure Laterality Date  . LAPAROSCOPIC ASSISTED VAGINAL HYSTERECTOMY N/A 09/25/2013   Procedure: LAPAROSCOPIC ASSISTED VAGINAL HYSTERECTOMY;  Surgeon: Eldred Manges, MD;  Location: [facility];  Service: Gynecology;  Laterality: N/A;  . TONSILLECTOMY AND ADENOIDECTOMY     at age 47    Past Obstetrical History:  OB History  Gravida Para Term Preterm AB Living  2 2       2   SAB TAB Ectopic Multiple Live Births               # Outcome Date GA Lbr Len/2nd Weight Sex Delivery Anes PTL Lv  2 Para           1 Para             Past Gynecological History: As per  HPI. History of HRT use: No  Social History:  Social History   Socioeconomic History  . Marital status: Married    Spouse name: Legrand Como  . Number of children: 2  . Years of education: Not on file  . Highest education level: Not on file  Occupational History  . Occupation: Garment/textile technologist: UNEMPLOYED  Social Needs  . Financial resource strain: Not on file  . Food insecurity    Worry: Not on file    Inability: Not on file  . Transportation needs    Medical: Not on file    Non-medical: Not on file  Tobacco Use  . Smoking status: Never Smoker  . Smokeless tobacco: Never Used  Substance and Sexual Activity  . Alcohol use: Yes    Comment: rare use  . Drug use: No  . Sexual activity: Not on file    Comment: necon  Lifestyle  . Physical activity    Days per week: Not on file    Minutes per session: Not on file  . Stress: Not on file  Relationships  . Social Herbalist on phone: Not on file    Gets together: Not on file  Attends religious service: Not on file    Active member of club or organization: Not on file    Attends meetings of clubs or organizations: Not on file    Relationship status: Not on file  . Intimate partner violence    Fear of current or ex partner: Not on file    Emotionally abused: Not on file    Physically abused: Not on file    Forced sexual activity: Not on file  Other Topics Concern  . Not on file  Social History Narrative  . Not on file    Family History:  Family History  Problem Relation Age of Onset  . Hypertension Mother   . Osteoporosis Mother   . Colon polyps Father   . Colon polyps Brother   . Diabetes Brother   . Osteoporosis Maternal Grandmother   . Colon cancer Neg Hx   . Esophageal cancer Neg Hx   . Rectal cancer Neg Hx   . Stomach cancer Neg Hx    Health Maintenance:  Mammogram(s): Yes.   Date: 2020  Medications Nailah A. [place] had no medications administered during this visit. Current  Outpatient Medications  Medication Sig Dispense Refill  . ALPRAZolam (XANAX) 0.5 MG tablet Take 1 tablet (0.5 mg total) by mouth daily as needed for anxiety. Take 1/2 to 1 tablet by mouth two  times daily as needed for anxiety 20 tablet 0  . aspirin 81 MG tablet Take 81 mg by mouth daily.    Marland Kitchen atorvastatin (LIPITOR) 20 MG tablet Take 1 tablet (20 mg total) by mouth daily at 6 PM. 90 tablet 0  . Bacillus Coagulans-Inulin (ALIGN PREBIOTIC-PROBIOTIC PO) Take by mouth daily.    . Calcium-Magnesium-Vitamin D (CALCIUM 500 PO) Take 1,000 mg by mouth daily.    . cetirizine (ZYRTEC) 10 MG tablet Take 10 mg by mouth daily.    . Cholecalciferol (VITAMIN D3) 25 MCG (1000 UT) CAPS Take 2,000 Units by mouth daily.    . citalopram (CELEXA) 20 MG tablet Take 20 mg by mouth daily.    Marland Kitchen docusate sodium (COLACE) 100 MG capsule Take 100 mg by mouth. TAKE 2 ONCE DAILY    . fish oil-omega-3 fatty acids 1000 MG capsule Take 1 g by mouth daily.     . hydrOXYzine (ATARAX/VISTARIL) 10 MG tablet Take 1 tablet (10 mg total) by mouth at bedtime as needed. 90 tablet 1  . Multiple Vitamin (MULTIVITAMIN WITH MINERALS) TABS tablet Take 1 tablet by mouth daily.    . niacin 500 MG tablet Take 500 mg by mouth at bedtime.    . Omega-3 Fatty Acids (FISH OIL) 1200 MG CAPS Take by mouth 3 (three) times daily.    . vitamin B-12 (CYANOCOBALAMIN) 1000 MCG tablet Take 1,000 mcg by mouth daily.    . vitamin C (ASCORBIC ACID) 500 MG tablet Take 500 mg by mouth daily.    . Vitamin D, Ergocalciferol, (DRISDOL) 1.25 MG (50000 UT) CAPS capsule Take 1 capsule (50,000 Units total) by mouth every 7 (seven) days. 12 capsule 0  . vitamin E 100 UNIT capsule Take 100 Units by mouth daily.    . polyethylene glycol powder (GLYCOLAX/MIRALAX) 17 GM/SCOOP powder Take 255 g by mouth 2 (two) times daily. 255 g 2  . psyllium (METAMUCIL) 58.6 % powder Take 1 packet by mouth 2 (two) times daily. 283 g 12   No current facility-administered medications for this  visit.     Allergies Patient has no known allergies.  Physical Exam:  BP 128/88   Pulse 81   Ht 5' (1.524 m)   Wt 174 lb 3.2 oz (79 kg)   LMP 04/15/2013   BMI 34.02 kg/m  Body mass index is 34.02 kg/m. General appearance: Well nourished, well developed female in no acute distress.  Cardiovascular: normal s1 and s2.  No murmurs, rubs or gallops. Respiratory:  Clear to auscultation bilateral. Normal respiratory effort Abdomen: positive bowel sounds and no masses, hernias; diffusely non tender to palpation, non distended Neuro/Psych:  Normal mood and affect.  Skin:  Warm and dry.  Lymphatic:  No inguinal lymphadenopathy.   Pelvic exam: is not limited by body habitus EGBUS: within normal limits Vagina: posterior, mobile, hard, two 3cm masses felt (nttp) about 2cm past the introitus. Vagina is visually normal and no blood or discharge in the vault, normal cuff. Cervix: surgically absent Uterus:  Surgically absent. No masses or pain in suprapubic area. Adnexa:  normal adnexa and no mass, fullness, tenderness Rectovaginal: +rectocele and above noted masses are fecal material, no blood on glove.   Laboratory: none  Radiology: none  Assessment: rectocele  Plan:  1. Rectocele I told her it's very common and some women even have to splint to help with BMs. I told her I recommend extra fiber with metamucil or miralax, weight loss, try to avoid straining and pelvic floor exercises. I offered formal PT for the latter but patient will try at home by herself. I told her if she wants a PT referral to let us know and if above isn't helpful, then can have her see urogyn. Pt amenable to plan.   RTC PRN  Durene Romans MD Attending [facility] [employer] farm manager)

## 2019-01-03 NOTE — Progress Notes (Signed)
Pt. States her vaginal area is itching on and off lasting since 6-8 weeks.   Pt. Also expresses concern of vaginal prolapse

## 2019-01-07 DIAGNOSIS — N816 Rectocele: Secondary | ICD-10-CM | POA: Insufficient documentation

## 2019-01-29 ENCOUNTER — Telehealth: Payer: Self-pay

## 2019-01-29 NOTE — Telephone Encounter (Signed)
LVM to call clinic, needs COVID screen and back lab info 11.17.2020 TLJ

## 2019-02-01 ENCOUNTER — Other Ambulatory Visit: Payer: 59

## 2019-02-15 ENCOUNTER — Other Ambulatory Visit: Payer: 59

## 2019-03-20 ENCOUNTER — Telehealth: Payer: Self-pay

## 2019-03-20 NOTE — Telephone Encounter (Signed)
LVM to call clinic, needs COVID screen and back lab info

## 2019-03-22 ENCOUNTER — Other Ambulatory Visit (INDEPENDENT_AMBULATORY_CARE_PROVIDER_SITE_OTHER): Payer: 59

## 2019-03-22 ENCOUNTER — Other Ambulatory Visit: Payer: Self-pay

## 2019-03-22 DIAGNOSIS — E282 Polycystic ovarian syndrome: Secondary | ICD-10-CM | POA: Diagnosis not present

## 2019-03-22 DIAGNOSIS — F419 Anxiety disorder, unspecified: Secondary | ICD-10-CM | POA: Diagnosis not present

## 2019-03-22 DIAGNOSIS — M722 Plantar fascial fibromatosis: Secondary | ICD-10-CM

## 2019-03-22 DIAGNOSIS — E559 Vitamin D deficiency, unspecified: Secondary | ICD-10-CM

## 2019-03-22 DIAGNOSIS — E782 Mixed hyperlipidemia: Secondary | ICD-10-CM

## 2019-03-22 LAB — COMPREHENSIVE METABOLIC PANEL
ALT: 21 U/L (ref 0–35)
AST: 16 U/L (ref 0–37)
Albumin: 4.5 g/dL (ref 3.5–5.2)
Alkaline Phosphatase: 52 U/L (ref 39–117)
BUN: 12 mg/dL (ref 6–23)
CO2: 29 mEq/L (ref 19–32)
Calcium: 9.6 mg/dL (ref 8.4–10.5)
Chloride: 104 mEq/L (ref 96–112)
Creatinine, Ser: 0.69 mg/dL (ref 0.40–1.20)
GFR: 89.71 mL/min (ref >60.00)
Glucose, Bld: 105 mg/dL — ABNORMAL HIGH (ref 70–99)
Potassium: 4.4 mEq/L (ref 3.5–5.1)
Sodium: 140 mEq/L (ref 135–145)
Total Bilirubin: 0.4 mg/dL (ref 0.2–1.2)
Total Protein: 7.4 g/dL (ref 6.0–8.3)

## 2019-03-22 LAB — LIPID PANEL
Cholesterol: 258 mg/dL — ABNORMAL HIGH (ref 0–200)
HDL: 40.4 mg/dL (ref >39.00)
LDL Cholesterol: 184 mg/dL — ABNORMAL HIGH (ref 0–99)
NonHDL: 217.86
Total CHOL/HDL Ratio: 6
Triglycerides: 169 mg/dL — ABNORMAL HIGH (ref 0.0–149.0)
VLDL: 33.8 mg/dL (ref 0.0–40.0)

## 2019-03-22 LAB — VITAMIN D 25 HYDROXY (VIT D DEFICIENCY, FRACTURES): VITD: 24.54 ng/mL — ABNORMAL LOW (ref 30.00–100.00)

## 2019-03-29 ENCOUNTER — Encounter: Payer: Self-pay | Admitting: Internal Medicine

## 2019-03-29 DIAGNOSIS — E559 Vitamin D deficiency, unspecified: Secondary | ICD-10-CM

## 2019-03-29 MED ORDER — VITAMIN D (ERGOCALCIFEROL) 1.25 MG (50000 UNIT) PO CAPS: 50000.0000 [IU] | ORAL_CAPSULE | ORAL | 0 refills | Status: DC

## 2019-04-02 MED ORDER — ATORVASTATIN CALCIUM 20 MG PO TABS: 20.0000 mg | ORAL_TABLET | Freq: Every day | ORAL | 0 refills | Status: DC

## 2019-04-02 NOTE — Addendum Note (Signed)
Addended by: Lurlean Nanny on: 04/02/2019 09:31 AM   Modules accepted: Orders

## 2019-04-11 ENCOUNTER — Encounter: Payer: Self-pay | Admitting: Internal Medicine

## 2019-04-11 NOTE — Telephone Encounter (Signed)
Will await pt response before responding.

## 2019-05-24 ENCOUNTER — Telehealth: Payer: Self-pay | Admitting: Internal Medicine

## 2019-05-24 NOTE — Telephone Encounter (Signed)
Last filled 11/12/2018... please advise

## 2019-05-31 MED ORDER — ALPRAZOLAM 0.5 MG PO TABS: 0.5000 mg | ORAL_TABLET | Freq: Every day | ORAL | 0 refills | Status: DC | PRN

## 2019-05-31 NOTE — Telephone Encounter (Signed)
1 tab PO daily prn

## 2019-05-31 NOTE — Addendum Note (Signed)
Addended by: Lurlean Nanny on: 05/31/2019 05:34 PM   Modules accepted: Orders

## 2019-05-31 NOTE — Telephone Encounter (Signed)
Courtney with [facility] called in regards to the patient's prescription. She stated that they have reached out to our office twice about getting clarification but advised Loma Sousa that I did not see any phone notes on our end where we received a call either time   She stated that they need clarification on the directions.  They stated there are 2 different directions sent.  Loma Sousa said she is trying to get this taken care of for the patient before the weekend .   Please advise   Pharmacy Phone- [phone]

## 2019-05-31 NOTE — Telephone Encounter (Signed)
Left updated sig instructions VM as the phone kept ringing for 3-4 minutes with no answer on the first try to call in with correct sig

## 2019-08-15 ENCOUNTER — Encounter: Payer: Self-pay | Admitting: Internal Medicine

## 2019-09-25 ENCOUNTER — Other Ambulatory Visit: Payer: Self-pay | Admitting: Internal Medicine

## 2019-09-25 NOTE — Telephone Encounter (Signed)
Xanax last filled 05/31/2019...Marland Kitchen please advise

## 2019-12-12 ENCOUNTER — Other Ambulatory Visit: Payer: Self-pay | Admitting: Internal Medicine

## 2019-12-12 DIAGNOSIS — Z1231 Encounter for screening mammogram for malignant neoplasm of breast: Secondary | ICD-10-CM

## 2020-01-03 ENCOUNTER — Ambulatory Visit: Payer: 59

## 2020-01-04 ENCOUNTER — Ambulatory Visit: Payer: 59

## 2020-01-08 ENCOUNTER — Other Ambulatory Visit: Payer: Self-pay

## 2020-01-08 ENCOUNTER — Ambulatory Visit (INDEPENDENT_AMBULATORY_CARE_PROVIDER_SITE_OTHER): Payer: 59

## 2020-01-08 DIAGNOSIS — Z23 Encounter for immunization: Secondary | ICD-10-CM

## 2020-01-24 ENCOUNTER — Ambulatory Visit: Payer: 59

## 2020-01-27 ENCOUNTER — Encounter: Payer: Self-pay | Admitting: Internal Medicine

## 2020-01-31 ENCOUNTER — Ambulatory Visit: Payer: 59 | Admitting: Internal Medicine

## 2020-03-19 IMAGING — MG DIGITAL SCREENING BILATERAL MAMMOGRAM WITH TOMO AND CAD
6 of 10 series · 6 of 30 positions shown · non-contrast
Comparison: Previous exam(s).

CLINICAL DATA: Screening.

EXAM:
DIGITAL SCREENING BILATERAL MAMMOGRAM WITH TOMO AND CAD

[R CC synth-2D]
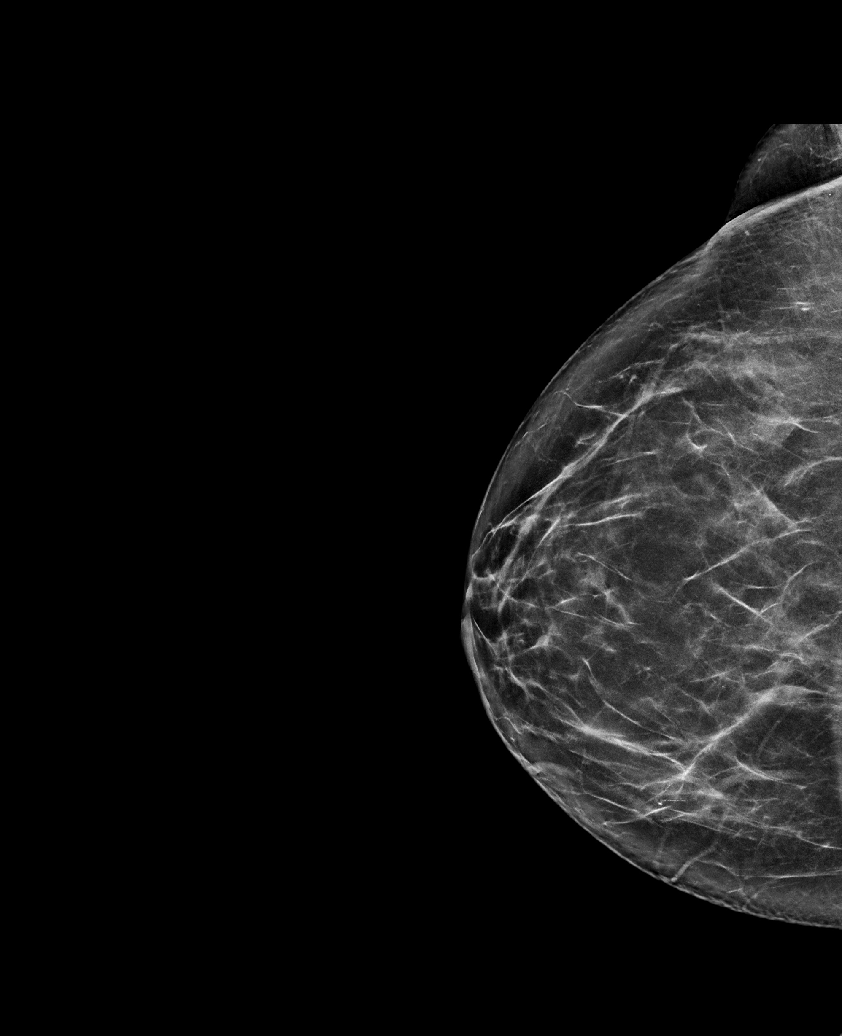

[L CC synth-2D]
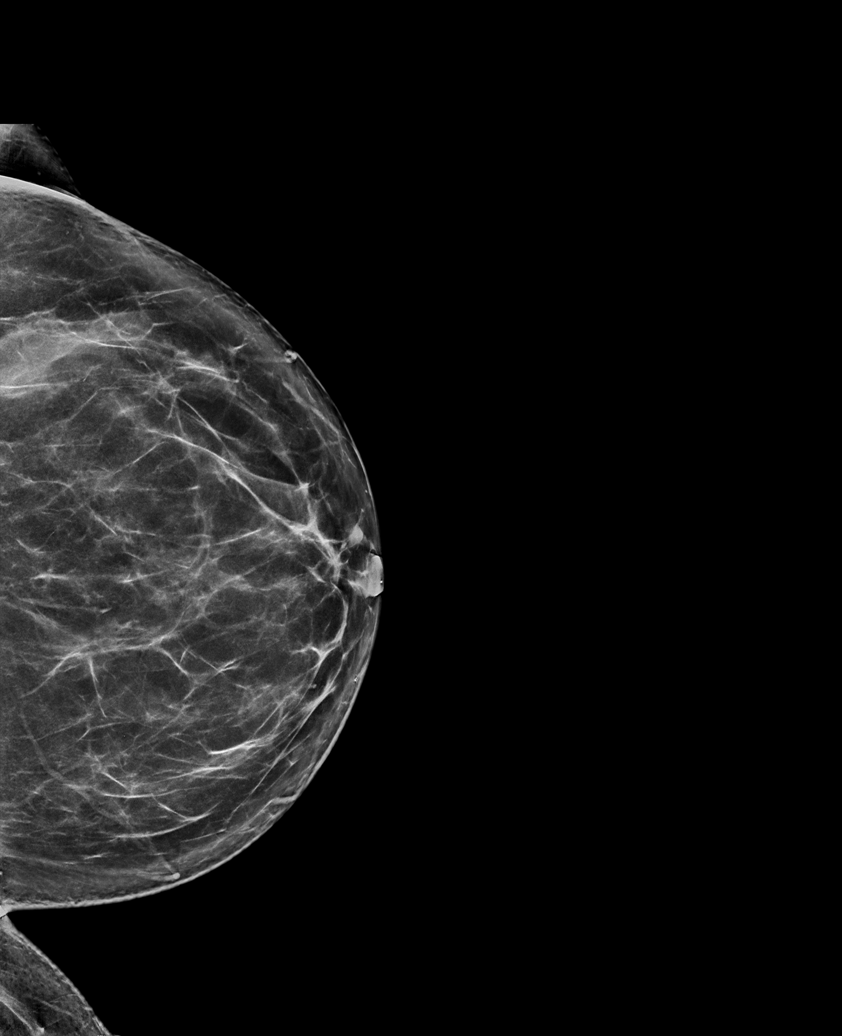

[L MLO synth-2D]
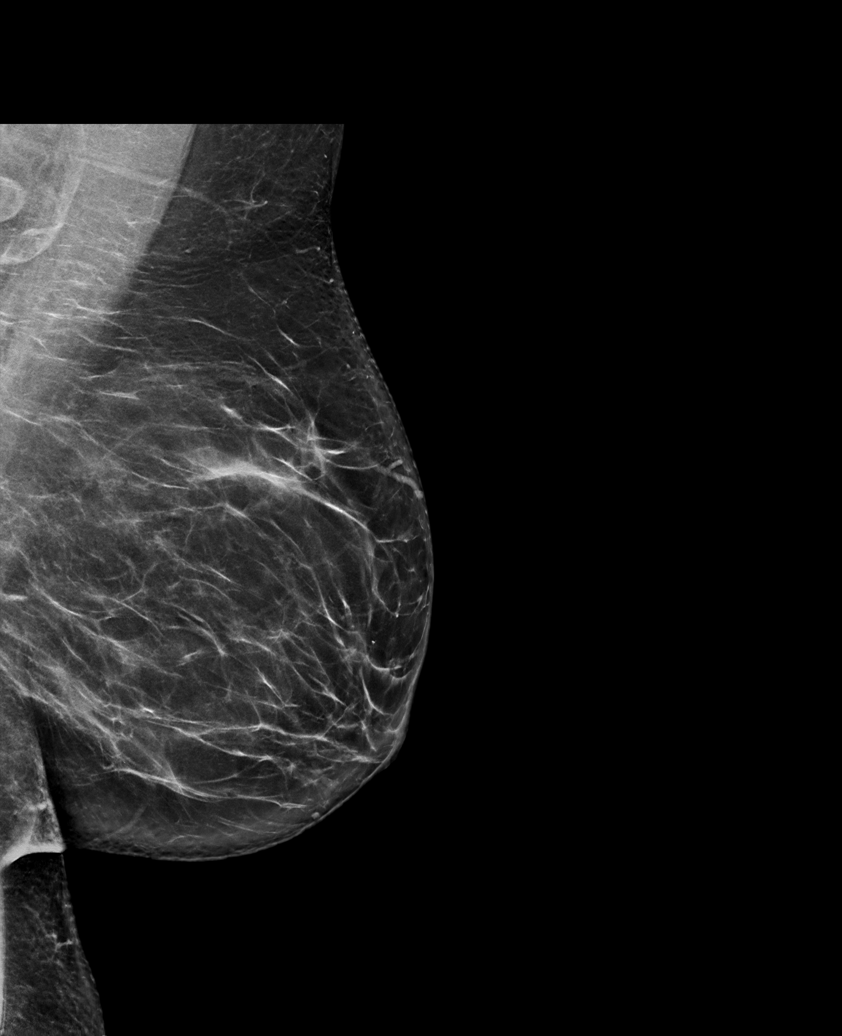

[R MLO synth-2D (1 of 2)]
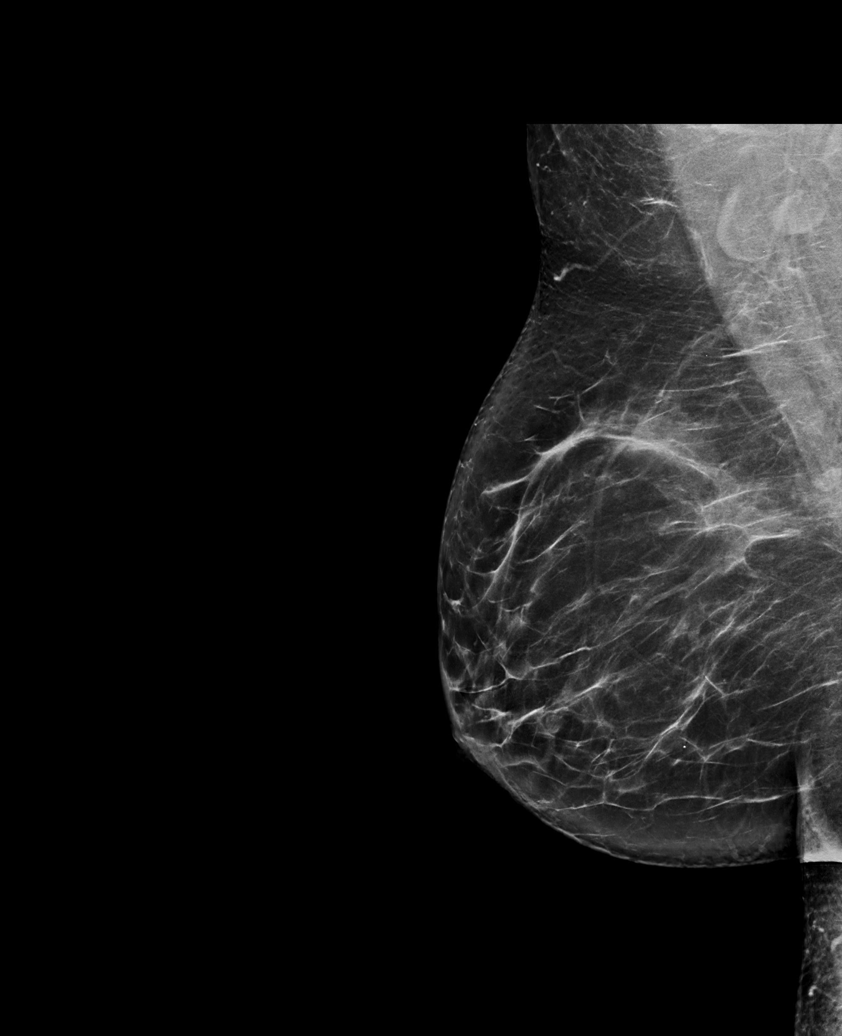

[R MLO synth-2D (2 of 2)]
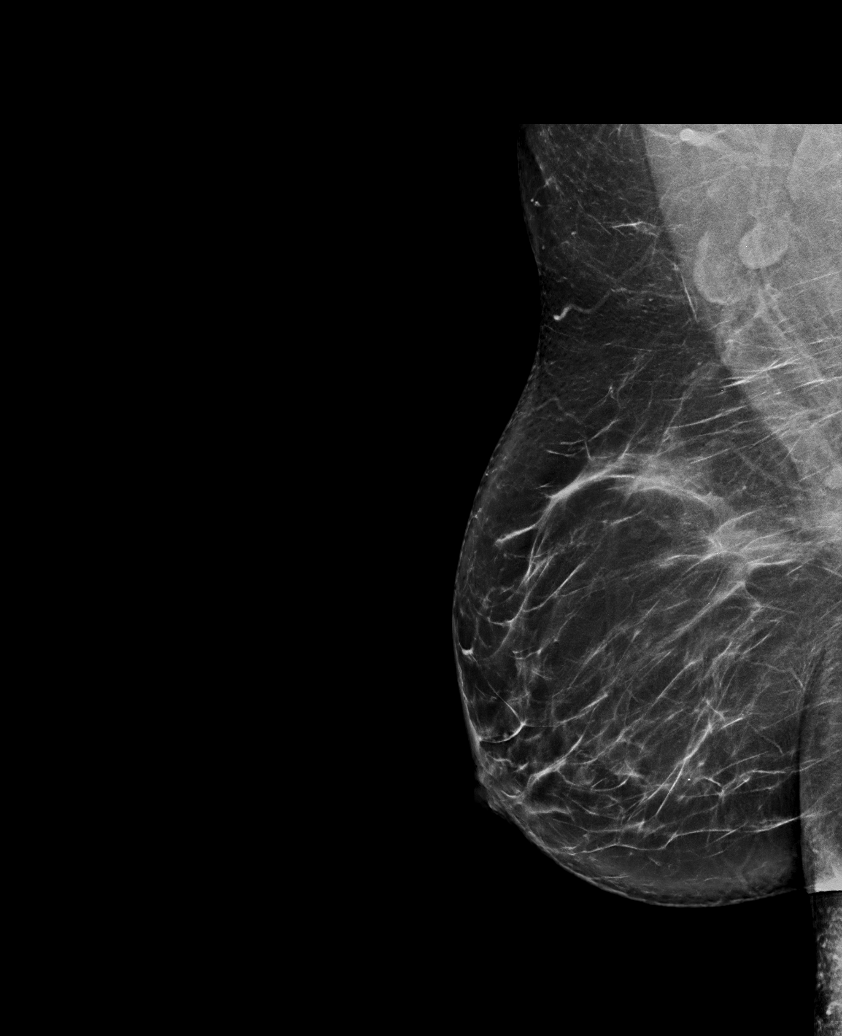

[L CC tomo · tomo slice 41/80.0]
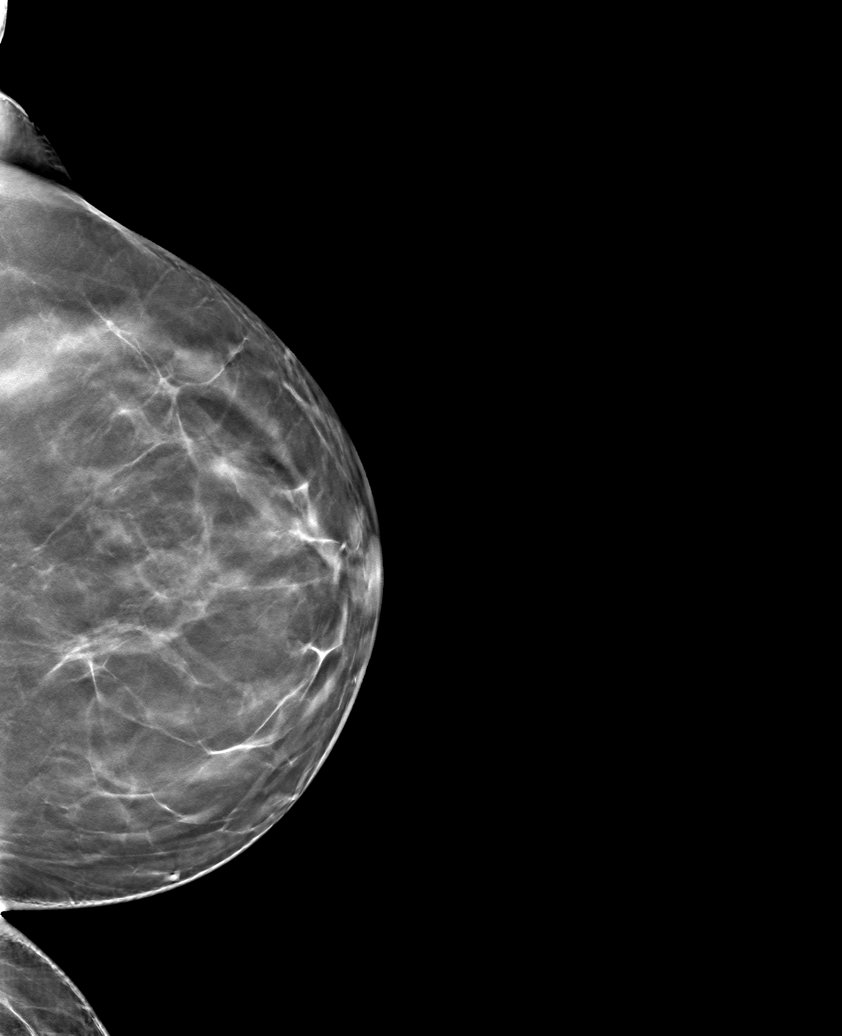

[6 of 30 positions shown; findings below may reference images not displayed]

ACR Breast Density Category b: There are scattered areas of
fibroglandular density.
FINDINGS: There are no findings suspicious for malignancy. Images were
processed with CAD.
IMPRESSION: No mammographic evidence of malignancy. A result letter of this
screening mammogram will be mailed directly to the patient.

RECOMMENDATION:
Screening mammogram in one year. (Code:[id number])

BI-RADS CATEGORY  1: Negative.

## 2020-03-20 ENCOUNTER — Other Ambulatory Visit: Payer: Self-pay

## 2020-03-20 ENCOUNTER — Ambulatory Visit
Admission: RE | Admit: 2020-03-20 | Discharge: 2020-03-20 | Disposition: A | Discharge status: Home / Self Care | Payer: 59 | Source: Ambulatory Visit | Attending: Internal Medicine | Admitting: Internal Medicine

## 2020-03-20 DIAGNOSIS — Z1231 Encounter for screening mammogram for malignant neoplasm of breast: Secondary | ICD-10-CM

## 2020-04-10 ENCOUNTER — Encounter: Payer: Self-pay | Admitting: Internal Medicine

## 2020-04-10 ENCOUNTER — Other Ambulatory Visit: Payer: Self-pay

## 2020-04-10 ENCOUNTER — Ambulatory Visit (INDEPENDENT_AMBULATORY_CARE_PROVIDER_SITE_OTHER)
Admission: RE | Admit: 2020-04-10 | Discharge: 2020-04-10 | Disposition: A | Discharge status: Home / Self Care | Payer: 59 | Source: Ambulatory Visit | Attending: Internal Medicine | Admitting: Internal Medicine

## 2020-04-10 ENCOUNTER — Ambulatory Visit (INDEPENDENT_AMBULATORY_CARE_PROVIDER_SITE_OTHER): Payer: 59 | Admitting: Internal Medicine

## 2020-04-10 VITALS — BP 124/84 | HR 86 | Temp 97.7°F | Ht 60.75 in | Wt 185.0 lb

## 2020-04-10 DIAGNOSIS — M25531 Pain in right wrist: Secondary | ICD-10-CM | POA: Diagnosis not present

## 2020-04-10 DIAGNOSIS — E782 Mixed hyperlipidemia: Secondary | ICD-10-CM

## 2020-04-10 DIAGNOSIS — R202 Paresthesia of skin: Secondary | ICD-10-CM

## 2020-04-10 DIAGNOSIS — Z5320 Procedure and treatment not carried out because of patient's decision for unspecified reasons: Secondary | ICD-10-CM | POA: Diagnosis not present

## 2020-04-10 DIAGNOSIS — Z0001 Encounter for general adult medical examination with abnormal findings: Secondary | ICD-10-CM

## 2020-04-10 DIAGNOSIS — E282 Polycystic ovarian syndrome: Secondary | ICD-10-CM | POA: Diagnosis not present

## 2020-04-10 DIAGNOSIS — Z113 Encounter for screening for infections with a predominantly sexual mode of transmission: Secondary | ICD-10-CM | POA: Diagnosis not present

## 2020-04-10 DIAGNOSIS — F419 Anxiety disorder, unspecified: Secondary | ICD-10-CM

## 2020-04-10 LAB — VITAMIN D 25 HYDROXY (VIT D DEFICIENCY, FRACTURES): Vit D, 25-Hydroxy: 25 ng/mL — ABNORMAL LOW (ref 30–100)

## 2020-04-10 LAB — VITAMIN B12: Vitamin B-12: 736 pg/mL (ref 200–1100)

## 2020-04-10 MED ORDER — METHYLPREDNISOLONE ACETATE 80 MG/ML IJ SUSP: 80.0000 mg | Freq: Once | INTRAMUSCULAR | Status: AC

## 2020-04-10 NOTE — Progress Notes (Addendum)
Subjective:    Patient ID: Kristen [name], female    DOB: [birth year], 50 y.o.   MRN: [id number]  HPI  Patient presents the clinic today for her annual exam.  She is also due to follow-up chronic conditions.  Anxiety: Persistent, managed on Sertraline, Hydroxyzine and Alprazolam.  She takes Alprazolam about 1 time a month.  She is not currently seeing a therapist.  She denies depression, SI/HI.  PCOS: Status post partial hysterectomy.  She is not taking any medications for this at this time.  HLD: Her last LDL was 184, 03/2019.  She denies myalgias on Atorvastatin  She tries to consume a low-fat diet.  Pt reports right wrist pain x 6 weeks, tingling bilateral hands x 2 months. She reports this worsened after a fall over the weekend. Her symptoms seem worse with driving or working on the computer. She has not noticed any swelling or bruising. She has been taking Ibuprofen and Tylenol with minimal relief. She has tried a brace as well.  Flu: 12/2019 Tetanus: 11/2014. Covid: [place] x2 Pap smear: Partial hysterectomy Mammogram: 03/2020 Colon screening: 11/2018 Vision screening: As needed Dentist: Biannually  Diet: She does eat meat.  She consumes fruits and veggies daily.  She tries to avoid fried foods.  She drinks mostly. Exercise: None  Review of Systems  Past Medical History:  Diagnosis Date  . Allergy    SEASONAL  . Anemia   . Anxiety   . Fracture of coccyx ([place])   . History of mononucleosis   . Mixed hyperlipidemia   . Plantar fasciitis   . Polycystic ovarian disease   . PONV (postoperative nausea and vomiting)   . Vitamin D deficiency     Current Outpatient Medications  Medication Sig Dispense Refill  . ALPRAZolam (XANAX) 0.5 MG tablet TAKE 1 TABLET BY MOUTH ONCE DAILY AS NEEDED FOR ANXIETY 20 tablet 0  . aspirin 81 MG tablet Take 81 mg by mouth daily.    Marland Kitchen atorvastatin (LIPITOR) 20 MG tablet Take 1 tablet (20 mg total) by mouth daily at 6 PM. 90 tablet 0  .  Bacillus Coagulans-Inulin (ALIGN PREBIOTIC-PROBIOTIC PO) Take by mouth daily.    . Calcium-Magnesium-Vitamin D (CALCIUM 500 PO) Take 1,000 mg by mouth daily.    . cetirizine (ZYRTEC) 10 MG tablet Take 10 mg by mouth daily.    . Cholecalciferol (VITAMIN D3) 25 MCG (1000 UT) CAPS Take 2,000 Units by mouth daily.    . hydrOXYzine (ATARAX/VISTARIL) 10 MG tablet TAKE 1 TABLET BY MOUTH AT BEDTIME AS NEEDED 90 tablet 0  . Multiple Vitamin (MULTIVITAMIN WITH MINERALS) TABS tablet Take 1 tablet by mouth daily.    . polyethylene glycol powder (GLYCOLAX/MIRALAX) 17 GM/SCOOP powder Take 255 g by mouth 2 (two) times daily. 255 g 2  . sertraline (ZOLOFT) 25 MG tablet Take 25 mg by mouth daily.    . vitamin B-12 (CYANOCOBALAMIN) 1000 MCG tablet Take 1,000 mcg by mouth daily.    . vitamin C (ASCORBIC ACID) 500 MG tablet Take 500 mg by mouth daily.    . vitamin E 100 UNIT capsule Take 100 Units by mouth daily.     No current facility-administered medications for this visit.    No Known Allergies  Family History  Problem Relation Age of Onset  . Hypertension Mother   . Osteoporosis Mother   . Colon polyps Father   . Colon polyps Brother   . Diabetes Brother   . Osteoporosis Maternal Grandmother   .  Colon cancer Neg Hx   . Esophageal cancer Neg Hx   . Rectal cancer Neg Hx   . Stomach cancer Neg Hx     Social History   Socioeconomic History  . Marital status: Divorced    Spouse name: Legrand Como  . Number of children: 2  . Years of education: Not on file  . Highest education level: Not on file  Occupational History  . Occupation: Garment/textile technologist: UNEMPLOYED  Tobacco Use  . Smoking status: Never Smoker  . Smokeless tobacco: Never Used  Vaping Use  . Vaping Use: Never used  Substance and Sexual Activity  . Alcohol use: Yes    Comment: rare use  . Drug use: No  . Sexual activity: Not on file    Comment: necon  Other Topics Concern  . Not on file  Social History Narrative  .  Not on file   Social Determinants of Health   Financial Resource Strain: Not on file  Food Insecurity: Not on file  Transportation Needs: Not on file  Physical Activity: Not on file  Stress: Not on file  Social Connections: Not on file  Intimate Partner Violence: Not on file     Constitutional: Denies fever, malaise, fatigue, headache or abrupt weight changes.  HEENT: Denies eye pain, eye redness, ear pain, ringing in the ears, wax buildup, runny nose, nasal congestion, bloody nose, or sore throat. Respiratory: Denies difficulty breathing, shortness of breath, cough or sputum production.   Cardiovascular: Denies chest pain, chest tightness, palpitations or swelling in the hands or feet.  Gastrointestinal: Denies abdominal pain, bloating, constipation, diarrhea or blood in the stool.  GU: Denies urgency, frequency, pain with urination, burning sensation, blood in urine, odor or discharge. Musculoskeletal: Pt reports right wrist pain. Denies decrease in range of motion, difficulty with gait, muscle pain or joint swelling.  Skin: Pt reports itchy rash of bilateral groins and perineal area. Denies lesions or ulcercations.  Neurological: Pt reports numbness and tingling in BUE. Denies dizziness, difficulty with memory, difficulty with speech or problems with balance and coordination.  Psych: Pt has a history of anxiety. Denies depression, SI/HI.  No other specific complaints in a complete review of systems (except as listed in HPI above).     Objective:   Physical Exam  BP 124/84   Pulse 86   Temp 97.7 F (36.5 C) (Temporal)   Ht 5' 0.75" (1.543 m)   Wt 185 lb (83.9 kg)   LMP 04/15/2013   SpO2 98%   BMI 35.24 kg/m  Wt Readings from Last 3 Encounters:  04/10/20 185 lb (83.9 kg)  01/03/19 174 lb 3.2 oz (79 kg)  11/30/18 173 lb (78.5 kg)    General: Appears her stated age, obese, in NAD. Skin: Warm, dry and intact.  HEENT: Head: normal shape and size;  Neck:  Neck supple,  trachea midline. No masses, lumps or thyromegaly present.  Cardiovascular: Normal rate and rhythm. S1,S2 noted.  No murmur, rubs or gallops noted. No JVD or BLE edema. No carotid bruits noted. Pulmonary/Chest: Normal effort and positive vesicular breath sounds. No respiratory distress. No wheezes, rales or ronchi noted.  Abdomen: Soft and nontender. Normal bowel sounds. No distention or masses noted. Liver, spleen and kidneys non palpable. Musculoskeletal: Normal flexion, extension and rotation of the right wrist. Pain with palpation over the distal radius, lateral carpals. No signs of joint swelling. Strength 5/5 BUE/BLE. Hand grips equal. No difficulty with gait.  Neurological: Alert  and oriented. Positive Phalen's, negative Tinel's Cranial nerves II-XII grossly intact. Coordination normal.  Psychiatric: Mood and affect normal. Behavior is normal. Judgment and thought content normal.    BMET    Component Value Date/Time   NA 140 03/22/2019 1345   K 4.4 03/22/2019 1345   CL 104 03/22/2019 1345   CO2 29 03/22/2019 1345   GLUCOSE 105 (H) 03/22/2019 1345   GLUCOSE 94 01/27/2006 0841   BUN 12 03/22/2019 1345   CREATININE 0.69 03/22/2019 1345   CREATININE 0.62 10/19/2018 1500   CALCIUM 9.6 03/22/2019 1345   GFRNONAA >90 12/06/2012 1910   GFRAA >90 12/06/2012 1910    Lipid Panel     Component Value Date/Time   CHOL 258 (H) 03/22/2019 1345   TRIG 169.0 (H) 03/22/2019 1345   TRIG 141 01/27/2006 0841   HDL 40.40 03/22/2019 1345   CHOLHDL 6 03/22/2019 1345   VLDL 33.8 03/22/2019 1345   LDLCALC 184 (H) 03/22/2019 1345   LDLCALC 133 (H) 10/19/2018 1500    CBC    Component Value Date/Time   WBC 9.8 10/19/2018 1500   RBC 4.91 10/19/2018 1500   HGB 15.2 10/19/2018 1500   HCT 44.2 10/19/2018 1500   PLT 256 10/19/2018 1500   MCV 90.0 10/19/2018 1500   MCH 31.0 10/19/2018 1500   MCHC 34.4 10/19/2018 1500   RDW 12.4 10/19/2018 1500   LYMPHSABS 3.3 11/07/2016 1004   MONOABS 0.6  11/07/2016 1004   EOSABS 0.3 11/07/2016 1004   BASOSABS 0.1 11/07/2016 1004    Hgb A1C Lab Results  Component Value Date   HGBA1C 5.6 10/19/2018         Assessment & Plan:   Preventive health maintenance:  Flu shot UTD Tetanus UTD Encouraged her to get her COVID booster She no longer needs Pap smears Mammogram UTD Colon screening UTD Encouraged her to consume a balanced diet and exercise regimen Advised her to see an eye doctor and dentist annually  We will check CBC, c-Met, TSH, lipid, A1c and hep C today  Paresthesias of Bilateral Upper Extremities:  We will attain TSH, B12 and vitamin D  Right Wrist Pain:  Xray right wrist today 80 mg Depo Medrol IM x 1  RTC in 1 year, sooner if needed Webb Silversmith, NP This visit occurred during the SARS-CoV-2 public health emergency.  Safety protocols were in place, including screening questions prior to the visit, additional usage of staff PPE, and extensive cleaning of exam room while observing appropriate contact time as indicated for disinfecting solutions.

## 2020-04-10 NOTE — Addendum Note (Signed)
Addended by: Ellamae Sia on: 04/10/2020 04:05 PM   Modules accepted: Orders

## 2020-04-10 NOTE — Assessment & Plan Note (Signed)
Managed off meds We will monitor

## 2020-04-10 NOTE — Patient Instructions (Signed)

## 2020-04-10 NOTE — Addendum Note (Signed)
Addended by: Lurlean Nanny on: 04/10/2020 04:30 PM   Modules accepted: Orders

## 2020-04-10 NOTE — Assessment & Plan Note (Signed)
She will increase sertraline to 50 mg daily Continue hydroxyzine and alprazolam as needed Support offered today We will monitor

## 2020-04-10 NOTE — Assessment & Plan Note (Signed)
C-Met and lipid profile today Encouraged her to consume a low-fat diet Continue atorvastatin 

## 2020-04-12 ENCOUNTER — Encounter: Payer: Self-pay | Admitting: Internal Medicine

## 2020-04-12 DIAGNOSIS — E782 Mixed hyperlipidemia: Secondary | ICD-10-CM

## 2020-04-13 LAB — COMPREHENSIVE METABOLIC PANEL
AG Ratio: 1.7 (calc) (ref 1.0–2.5)
ALT: 36 U/L — ABNORMAL HIGH (ref 6–29)
AST: 24 U/L (ref 10–35)
Albumin: 4.6 g/dL (ref 3.6–5.1)
Alkaline phosphatase (APISO): 54 U/L (ref 37–153)
BUN: 14 mg/dL (ref 7–25)
CO2: 26 mmol/L (ref 20–32)
Calcium: 10 mg/dL (ref 8.6–10.4)
Chloride: 102 mmol/L (ref 98–110)
Creat: 0.62 mg/dL (ref 0.50–1.05)
Globulin: 2.7 g/dL (calc) (ref 1.9–3.7)
Glucose, Bld: 84 mg/dL (ref 65–99)
Potassium: 4.4 mmol/L (ref 3.5–5.3)
Sodium: 139 mmol/L (ref 135–146)
Total Bilirubin: 0.6 mg/dL (ref 0.2–1.2)
Total Protein: 7.3 g/dL (ref 6.1–8.1)

## 2020-04-13 LAB — CBC
HCT: 42.9 % (ref 35.0–45.0)
Hemoglobin: 14.7 g/dL (ref 11.7–15.5)
MCH: 31.4 pg (ref 27.0–33.0)
MCHC: 34.3 g/dL (ref 32.0–36.0)
MCV: 91.7 fL (ref 80.0–100.0)
MPV: 9.7 fL (ref 7.5–12.5)
Platelets: 255 10*3/uL (ref 140–400)
RBC: 4.68 10*6/uL (ref 3.80–5.10)
RDW: 12.4 % (ref 11.0–15.0)
WBC: 10.1 10*3/uL (ref 3.8–10.8)

## 2020-04-13 LAB — LIPID PANEL
Cholesterol: 169 mg/dL (ref <200)
HDL: 38 mg/dL — ABNORMAL LOW (ref >=50)
LDL Cholesterol (Calc): 103 mg/dL (calc) — ABNORMAL HIGH
Non-HDL Cholesterol (Calc): 131 mg/dL (calc) — ABNORMAL HIGH (ref <130)
Total CHOL/HDL Ratio: 4.4 (calc) (ref <5.0)
Triglycerides: 160 mg/dL — ABNORMAL HIGH (ref <150)

## 2020-04-13 LAB — C. TRACHOMATIS/N. GONORRHOEAE RNA
C. trachomatis RNA, TMA: NOT DETECTED
N. gonorrhoeae RNA, TMA: NOT DETECTED

## 2020-04-13 LAB — HEPATITIS C ANTIBODY
Hepatitis C Ab: NONREACTIVE
SIGNAL TO CUT-OFF: 0.01 (ref <1.00)

## 2020-04-13 LAB — HEMOGLOBIN A1C
Hgb A1c MFr Bld: 5.7 % of total Hgb — ABNORMAL HIGH (ref <5.7)
Mean Plasma Glucose: 117 mg/dL
eAG (mmol/L): 6.5 mmol/L

## 2020-04-13 LAB — TRICHOMONAS VAGINALIS, PROBE AMP: Trichomonas vaginalis RNA: NOT DETECTED

## 2020-04-13 LAB — TSH: TSH: 1.74 mIU/L

## 2020-04-15 ENCOUNTER — Encounter: Payer: Self-pay | Admitting: Internal Medicine

## 2020-04-15 MED ORDER — ATORVASTATIN CALCIUM 20 MG PO TABS: 20.0000 mg | ORAL_TABLET | Freq: Every day | ORAL | 1 refills | Status: DC

## 2020-04-15 MED ORDER — SERTRALINE HCL 50 MG PO TABS: 50.0000 mg | ORAL_TABLET | Freq: Every day | ORAL | 1 refills | Status: DC

## 2020-04-15 MED ORDER — HYDROXYZINE HCL 10 MG PO TABS: 10.0000 mg | ORAL_TABLET | Freq: Every evening | ORAL | 1 refills | Status: DC | PRN

## 2020-04-21 ENCOUNTER — Other Ambulatory Visit: Payer: Self-pay

## 2020-04-21 MED ORDER — ALPRAZOLAM 0.5 MG PO TABS
ORAL_TABLET | ORAL | 0 refills | Status: AC
Start: 2020-04-21 — End: ?

## 2020-04-21 NOTE — Telephone Encounter (Signed)
Last filled 09/25/2019... please advise

## 2020-05-31 ENCOUNTER — Encounter: Payer: Self-pay | Admitting: Internal Medicine

## 2020-09-22 ENCOUNTER — Other Ambulatory Visit: Payer: Self-pay | Admitting: Internal Medicine

## 2020-12-01 ENCOUNTER — Other Ambulatory Visit: Payer: Self-pay | Admitting: Family

## 2020-12-07 ENCOUNTER — Encounter: Payer: Self-pay | Admitting: Internal Medicine

## 2020-12-07 DIAGNOSIS — E782 Mixed hyperlipidemia: Secondary | ICD-10-CM

## 2020-12-07 MED ORDER — SERTRALINE HCL 50 MG PO TABS: 50.0000 mg | ORAL_TABLET | Freq: Every day | ORAL | 1 refills | Status: AC

## 2020-12-07 MED ORDER — ATORVASTATIN CALCIUM 20 MG PO TABS: 20.0000 mg | ORAL_TABLET | Freq: Every day | ORAL | 1 refills | Status: AC

## 2021-02-12 ENCOUNTER — Encounter: Payer: Self-pay | Admitting: Internal Medicine

## 2021-02-12 ENCOUNTER — Telehealth: Payer: 59 | Admitting: Physician Assistant

## 2021-02-12 DIAGNOSIS — U071 COVID-19: Secondary | ICD-10-CM | POA: Diagnosis not present

## 2021-02-12 MED ORDER — ALBUTEROL SULFATE HFA 108 (90 BASE) MCG/ACT IN AERS: 2.0000 | INHALATION_SPRAY | Freq: Four times a day (QID) | RESPIRATORY_TRACT | 0 refills | Status: DC | PRN

## 2021-02-12 MED ORDER — FLUTICASONE PROPIONATE 50 MCG/ACT NA SUSP: 2.0000 | Freq: Every day | NASAL | 0 refills | Status: AC

## 2021-02-12 MED ORDER — BENZONATATE 100 MG PO CAPS: 100.0000 mg | ORAL_CAPSULE | Freq: Three times a day (TID) | ORAL | 0 refills | Status: DC | PRN

## 2021-02-12 NOTE — Progress Notes (Signed)
  E-Visit  for Positive Covid Test Result  We are sorry you are not feeling well. We are here to help!  You have tested positive for COVID-19, meaning that you were infected with the novel coronavirus and could give the virus to others.  It is vitally important that you stay home so you do not spread it to others.      Please continue isolation at home, for at least 10 days since the start of your symptoms and until you have had 24 hours with no fever (without taking a fever reducer) and with improving of symptoms.  If you have no symptoms but tested positive (or all symptoms resolve after 5 days and you have no fever) you can leave your house but continue to wear a mask around others for an additional 5 days. If you have a fever,continue to stay home until you have had 24 hours of no fever. Most cases improve 5-10 days from onset but we have seen a small number of patients who have gotten worse after the 10 days.  Please be sure to watch for worsening symptoms and remain taking the proper precautions.   Go to the nearest hospital ED for assessment if fever/cough/breathlessness are severe or illness seems like a threat to life.    The following symptoms may appear 2-14 days after exposure: Fever Cough Shortness of breath or difficulty breathing Chills Repeated shaking with chills Muscle pain Headache Sore throat New loss of taste or smell Fatigue Congestion or runny nose Nausea or vomiting Diarrhea  You have been enrolled in MyChart Home Monitoring for COVID-19. Daily you will receive a questionnaire within the MyChart website. Our COVID-19 response team will be monitoring your responses daily.  You can use medication such as prescription cough medication called Tessalon Perles 100 mg. You may take 1-2 capsules every 8 hours as needed for cough,  prescription inhaler called Albuterol MDI 90 mcg /actuation 2 puffs every 4 hours as needed for shortness of breath, wheezing, cough, and  prescription for Fluticasone nasal spray 2 sprays in each nostril one time per day  You may also take acetaminophen (Tylenol) as needed for fever.  HOME CARE: Only take medications as instructed by your medical team. Drink plenty of fluids and get plenty of rest. A steam or ultrasonic humidifier can help if you have congestion.   GET HELP RIGHT AWAY IF YOU HAVE EMERGENCY WARNING SIGNS.  Call 911 or proceed to your closest emergency facility if: You develop worsening high fever. Trouble breathing Bluish lips or face Persistent pain or pressure in the chest New confusion Inability to wake or stay awake You cough up blood. Your symptoms become more severe Inability to hold down food or fluids  This list is not all possible symptoms. Contact your medical provider for any symptoms that are severe or concerning to you.    Your e-visit answers were reviewed by a board certified advanced clinical practitioner to complete your personal care plan.  Depending on the condition, your plan could have included both over the counter or prescription medications.  If there is a problem please reply once you have received a response from your provider.  Your safety is important to us.  If you have drug allergies check your prescription carefully.    You can use MyChart to ask questions about today's visit, request a non-urgent call back, or ask for a work or school excuse for 24 hours related to this e-Visit. If it has   been greater than 24 hours you will need to follow up with your provider, or enter a new e-Visit to address those concerns. You will get an e-mail in the next two days asking about your experience.  I hope that your e-visit has been valuable and will speed your recovery. Thank you for using e-visits.   I provided 5 minutes of non face-to-face time during this encounter for chart review and documentation.

## 2021-02-23 ENCOUNTER — Other Ambulatory Visit: Payer: Self-pay | Admitting: Internal Medicine

## 2021-02-23 DIAGNOSIS — Z1231 Encounter for screening mammogram for malignant neoplasm of breast: Secondary | ICD-10-CM

## 2021-04-02 ENCOUNTER — Ambulatory Visit: Payer: 59

## 2021-06-25 ENCOUNTER — Ambulatory Visit
Admission: RE | Admit: 2021-06-25 | Discharge: 2021-06-25 | Disposition: A | Discharge status: Home / Self Care | Payer: Commercial Managed Care - PPO | Source: Ambulatory Visit | Attending: Internal Medicine | Admitting: Internal Medicine

## 2021-06-25 DIAGNOSIS — Z1231 Encounter for screening mammogram for malignant neoplasm of breast: Secondary | ICD-10-CM

## 2022-03-21 ENCOUNTER — Other Ambulatory Visit: Payer: Self-pay | Admitting: Internal Medicine

## 2022-03-21 DIAGNOSIS — Z1231 Encounter for screening mammogram for malignant neoplasm of breast: Secondary | ICD-10-CM

## 2022-05-24 ENCOUNTER — Other Ambulatory Visit: Payer: Self-pay | Admitting: Internal Medicine

## 2022-05-24 DIAGNOSIS — E782 Mixed hyperlipidemia: Secondary | ICD-10-CM

## 2022-05-24 DIAGNOSIS — Z8249 Family history of ischemic heart disease and other diseases of the circulatory system: Secondary | ICD-10-CM

## 2022-05-24 DIAGNOSIS — Z0000 Encounter for general adult medical examination without abnormal findings: Secondary | ICD-10-CM

## 2022-06-24 ENCOUNTER — Ambulatory Visit
Admission: RE | Admit: 2022-06-24 | Discharge: 2022-06-24 | Disposition: A | Discharge status: Home / Self Care | Payer: Commercial Managed Care - PPO | Source: Ambulatory Visit | Attending: Internal Medicine | Admitting: Internal Medicine

## 2022-06-24 DIAGNOSIS — Z8249 Family history of ischemic heart disease and other diseases of the circulatory system: Secondary | ICD-10-CM | POA: Insufficient documentation

## 2022-06-24 DIAGNOSIS — E782 Mixed hyperlipidemia: Secondary | ICD-10-CM

## 2022-06-24 DIAGNOSIS — Z0000 Encounter for general adult medical examination without abnormal findings: Secondary | ICD-10-CM

## 2022-07-01 ENCOUNTER — Ambulatory Visit: Payer: Commercial Managed Care - PPO

## 2022-07-01 ENCOUNTER — Ambulatory Visit
Admission: RE | Admit: 2022-07-01 | Discharge: 2022-07-01 | Disposition: A | Discharge status: Home / Self Care | Payer: Commercial Managed Care - PPO | Source: Ambulatory Visit | Attending: Internal Medicine | Admitting: Internal Medicine

## 2022-07-01 DIAGNOSIS — Z1231 Encounter for screening mammogram for malignant neoplasm of breast: Secondary | ICD-10-CM

## 2023-06-12 ENCOUNTER — Other Ambulatory Visit: Payer: Self-pay | Admitting: Internal Medicine

## 2023-06-12 DIAGNOSIS — Z1231 Encounter for screening mammogram for malignant neoplasm of breast: Secondary | ICD-10-CM

## 2023-07-14 ENCOUNTER — Ambulatory Visit

## 2023-08-11 ENCOUNTER — Ambulatory Visit
Admission: RE | Admit: 2023-08-11 | Discharge: 2023-08-11 | Disposition: A | Discharge status: Home / Self Care | Source: Ambulatory Visit | Attending: Internal Medicine | Admitting: Internal Medicine

## 2023-08-11 DIAGNOSIS — Z1231 Encounter for screening mammogram for malignant neoplasm of breast: Secondary | ICD-10-CM

## 2023-11-28 ENCOUNTER — Encounter: Payer: Self-pay | Admitting: Gastroenterology

## 2024-02-22 ENCOUNTER — Encounter: Payer: Self-pay | Admitting: Gastroenterology

## 2024-04-08 ENCOUNTER — Other Ambulatory Visit: Payer: Self-pay | Admitting: Medical Genetics

## 2024-04-12 ENCOUNTER — Ambulatory Visit

## 2024-04-12 ENCOUNTER — Other Ambulatory Visit

## 2024-04-12 ENCOUNTER — Other Ambulatory Visit
Admission: RE | Admit: 2024-04-12 | Discharge: 2024-04-12 | Disposition: A | Payer: Self-pay | Source: Ambulatory Visit | Attending: Medical Genetics | Admitting: Medical Genetics

## 2024-04-12 ENCOUNTER — Telehealth: Payer: Self-pay | Admitting: Gastroenterology

## 2024-04-12 VITALS — Ht 60.75 in | Wt 182.0 lb

## 2024-04-12 DIAGNOSIS — Z860100 Personal history of colon polyps, unspecified: Secondary | ICD-10-CM

## 2024-04-12 MED ORDER — NA SULFATE-K SULFATE-MG SULF 17.5-3.13-1.6 GM/177ML PO SOLN: 1.0000 | Freq: Once | ORAL | 0 refills | Status: AC

## 2024-04-12 NOTE — Telephone Encounter (Signed)
 Pts PV has been completed

## 2024-04-12 NOTE — Telephone Encounter (Signed)
 Patient called stating her son received a call from the nurse around 6:00 pm yesterday wanting to change patient in person PV appointment to a telephone call. No notes documented. Please advise, thank you

## 2024-04-12 NOTE — Progress Notes (Unsigned)
Pre visit completed via phone call; Patient verified name, DOB, and address; No egg or soy allergy known to patient;  No issues known to pt with past sedation with any surgeries or procedures; Patient denies ever being told they had issues or difficulty with intubation;  No FH of Malignant Hyperthermia; Pt is not on diet pills; Pt is not on home 02;  Pt is not on blood thinners;  Pt denies issues with constipation;  No A fib or A flutter; Have any cardiac testing pending--NO Insurance verified during PV appt--- UHC Pt can ambulate without assistance;  Pt denies use of chewing tobacco; Discussed diabetic/weight loss medication holds; Discussed NSAID holds; Checked BMI to be less than 50; Pt instructed to use [employer] or GoodRx for a price reduction on prep;  Patient's chart reviewed by Cathlyn Parsons CNRA prior to previsit and patient appropriate for the [facility].  Pre visit completed and red dot placed by patient's name on their procedure day (on provider's schedule).    Instructions sent to MyChart per patient request;

## 2024-04-26 ENCOUNTER — Encounter: Admitting: Gastroenterology

## 2024-05-31 ENCOUNTER — Ambulatory Visit: Admitting: General Practice
